# Patient Record
Sex: Male | Born: 1950 | Race: White | Hispanic: No | Smoking: Former smoker
Health system: Southern US, Community
[De-identification: ages and names within clinical notes are randomized; demographics above are authoritative.]

## PROBLEM LIST (undated history)

## (undated) DIAGNOSIS — I639 Cerebral infarction, unspecified: Secondary | ICD-10-CM

## (undated) DIAGNOSIS — I10 Essential (primary) hypertension: Secondary | ICD-10-CM

## (undated) DIAGNOSIS — F419 Anxiety disorder, unspecified: Secondary | ICD-10-CM

## (undated) DIAGNOSIS — E7800 Pure hypercholesterolemia, unspecified: Secondary | ICD-10-CM

## (undated) DIAGNOSIS — M1990 Unspecified osteoarthritis, unspecified site: Secondary | ICD-10-CM

## (undated) DIAGNOSIS — K219 Gastro-esophageal reflux disease without esophagitis: Secondary | ICD-10-CM

## (undated) DIAGNOSIS — I712 Thoracic aortic aneurysm, without rupture, unspecified: Secondary | ICD-10-CM

## (undated) DIAGNOSIS — N200 Calculus of kidney: Secondary | ICD-10-CM

## (undated) DIAGNOSIS — Z7901 Long term (current) use of anticoagulants: Secondary | ICD-10-CM

## (undated) DIAGNOSIS — I719 Aortic aneurysm of unspecified site, without rupture: Secondary | ICD-10-CM

## (undated) DIAGNOSIS — N400 Enlarged prostate without lower urinary tract symptoms: Secondary | ICD-10-CM

## (undated) DIAGNOSIS — I2510 Atherosclerotic heart disease of native coronary artery without angina pectoris: Secondary | ICD-10-CM

## (undated) DIAGNOSIS — E785 Hyperlipidemia, unspecified: Secondary | ICD-10-CM

## (undated) DIAGNOSIS — F329 Major depressive disorder, single episode, unspecified: Secondary | ICD-10-CM

## (undated) DIAGNOSIS — M6281 Muscle weakness (generalized): Secondary | ICD-10-CM

## (undated) DIAGNOSIS — IMO0001 Reserved for inherently not codable concepts without codable children: Secondary | ICD-10-CM

## (undated) DIAGNOSIS — I4891 Unspecified atrial fibrillation: Secondary | ICD-10-CM

## (undated) DIAGNOSIS — E039 Hypothyroidism, unspecified: Secondary | ICD-10-CM

## (undated) DIAGNOSIS — Z720 Tobacco use: Secondary | ICD-10-CM

## (undated) DIAGNOSIS — I4892 Unspecified atrial flutter: Secondary | ICD-10-CM

## (undated) DIAGNOSIS — I739 Peripheral vascular disease, unspecified: Secondary | ICD-10-CM

## (undated) DIAGNOSIS — I509 Heart failure, unspecified: Secondary | ICD-10-CM

## (undated) HISTORY — DX: Atherosclerotic heart disease of native coronary artery without angina pectoris: I25.10

## (undated) HISTORY — DX: Heart failure, unspecified: I50.9

## (undated) HISTORY — DX: Major depressive disorder, single episode, unspecified: F32.9

## (undated) HISTORY — DX: Hyperlipidemia, unspecified: E78.5

## (undated) HISTORY — DX: Benign prostatic hyperplasia without lower urinary tract symptoms: N40.0

## (undated) HISTORY — PX: CATARACT EXTRACTION W/ INTRAOCULAR LENS  IMPLANT, BILATERAL: SHX1307

## (undated) HISTORY — PX: APPENDECTOMY: SHX54

## (undated) HISTORY — DX: Hypothyroidism, unspecified: E03.9

## (undated) HISTORY — DX: Long term (current) use of anticoagulants: Z79.01

## (undated) HISTORY — DX: Unspecified atrial fibrillation: I48.91

## (undated) HISTORY — DX: Calculus of kidney: N20.0

## (undated) HISTORY — DX: Cerebral infarction, unspecified: I63.9

## (undated) HISTORY — DX: Peripheral vascular disease, unspecified: I73.9

## (undated) HISTORY — DX: Gastro-esophageal reflux disease without esophagitis: K21.9

## (undated) HISTORY — DX: Unspecified atrial flutter: I48.92

## (undated) HISTORY — PX: LITHOTRIPSY: SUR834

## (undated) HISTORY — DX: Muscle weakness (generalized): M62.81

## (undated) HISTORY — PX: CARDIOVERSION: SHX1299

## (undated) HISTORY — DX: Essential (primary) hypertension: I10

## (undated) HISTORY — DX: Tobacco use: Z72.0

## (undated) HISTORY — PX: CORONARY ANGIOPLASTY WITH STENT PLACEMENT: SHX49

## (undated) HISTORY — DX: Anxiety disorder, unspecified: F41.9

---

## 2004-05-26 DIAGNOSIS — I2510 Atherosclerotic heart disease of native coronary artery without angina pectoris: Secondary | ICD-10-CM

## 2004-05-26 DIAGNOSIS — I639 Cerebral infarction, unspecified: Secondary | ICD-10-CM

## 2004-05-26 HISTORY — DX: Cerebral infarction, unspecified: I63.9

## 2004-05-26 HISTORY — DX: Atherosclerotic heart disease of native coronary artery without angina pectoris: I25.10

## 2010-03-19 ENCOUNTER — Emergency Department (HOSPITAL_COMMUNITY): Admission: EM | Admit: 2010-03-19 | Discharge: 2010-03-20 | Payer: Self-pay | Admitting: Emergency Medicine

## 2010-04-17 ENCOUNTER — Emergency Department (HOSPITAL_COMMUNITY): Admission: EM | Admit: 2010-04-17 | Discharge: 2010-04-17 | Payer: Self-pay | Admitting: Emergency Medicine

## 2010-05-26 DIAGNOSIS — I4892 Unspecified atrial flutter: Secondary | ICD-10-CM

## 2010-05-26 DIAGNOSIS — Z7901 Long term (current) use of anticoagulants: Secondary | ICD-10-CM

## 2010-05-26 HISTORY — DX: Long term (current) use of anticoagulants: Z79.01

## 2010-05-26 HISTORY — DX: Unspecified atrial flutter: I48.92

## 2010-10-24 ENCOUNTER — Emergency Department (HOSPITAL_COMMUNITY): Payer: Medicare Other

## 2010-10-24 ENCOUNTER — Inpatient Hospital Stay (HOSPITAL_COMMUNITY)
Admission: EM | Admit: 2010-10-24 | Discharge: 2010-10-26 | Disposition: A | Discharge status: Other Acute Inpatient Hospital | Payer: Medicare Other | Source: Home / Self Care

## 2010-10-24 LAB — CBC
HCT: 41.6 % (ref 39.0–52.0)
Hemoglobin: 14 g/dL (ref 13.0–17.0)
RBC: 4.53 MIL/uL (ref 4.22–5.81)
RDW: 13 % (ref 11.5–15.5)
WBC: 11.4 10*3/uL — ABNORMAL HIGH (ref 4.0–10.5)

## 2010-10-24 LAB — DIFFERENTIAL
Basophils Absolute: 0.1 10*3/uL (ref 0.0–0.1)
Eosinophils Relative: 1 % (ref 0–5)
Lymphocytes Relative: 12 % (ref 12–46)
Neutro Abs: 9.5 10*3/uL — ABNORMAL HIGH (ref 1.7–7.7)
Neutrophils Relative %: 83 % — ABNORMAL HIGH (ref 43–77)

## 2010-10-25 ENCOUNTER — Inpatient Hospital Stay (HOSPITAL_COMMUNITY): Payer: Medicare Other

## 2010-10-25 DIAGNOSIS — Z7982 Long term (current) use of aspirin: Secondary | ICD-10-CM

## 2010-10-25 DIAGNOSIS — Z6379 Other stressful life events affecting family and household: Secondary | ICD-10-CM

## 2010-10-25 DIAGNOSIS — I129 Hypertensive chronic kidney disease with stage 1 through stage 4 chronic kidney disease, or unspecified chronic kidney disease: Secondary | ICD-10-CM | POA: Diagnosis present

## 2010-10-25 DIAGNOSIS — Z880 Allergy status to penicillin: Secondary | ICD-10-CM

## 2010-10-25 DIAGNOSIS — I059 Rheumatic mitral valve disease, unspecified: Secondary | ICD-10-CM

## 2010-10-25 DIAGNOSIS — R079 Chest pain, unspecified: Secondary | ICD-10-CM

## 2010-10-25 DIAGNOSIS — I428 Other cardiomyopathies: Secondary | ICD-10-CM | POA: Diagnosis present

## 2010-10-25 DIAGNOSIS — I5189 Other ill-defined heart diseases: Secondary | ICD-10-CM | POA: Diagnosis present

## 2010-10-25 DIAGNOSIS — I509 Heart failure, unspecified: Secondary | ICD-10-CM | POA: Diagnosis present

## 2010-10-25 DIAGNOSIS — E039 Hypothyroidism, unspecified: Secondary | ICD-10-CM | POA: Diagnosis present

## 2010-10-25 DIAGNOSIS — Z7902 Long term (current) use of antithrombotics/antiplatelets: Secondary | ICD-10-CM

## 2010-10-25 DIAGNOSIS — Z23 Encounter for immunization: Secondary | ICD-10-CM

## 2010-10-25 DIAGNOSIS — F17200 Nicotine dependence, unspecified, uncomplicated: Secondary | ICD-10-CM | POA: Diagnosis present

## 2010-10-25 DIAGNOSIS — I2510 Atherosclerotic heart disease of native coronary artery without angina pectoris: Secondary | ICD-10-CM | POA: Diagnosis present

## 2010-10-25 DIAGNOSIS — Z8673 Personal history of transient ischemic attack (TIA), and cerebral infarction without residual deficits: Secondary | ICD-10-CM

## 2010-10-25 DIAGNOSIS — Z9861 Coronary angioplasty status: Secondary | ICD-10-CM

## 2010-10-25 DIAGNOSIS — N182 Chronic kidney disease, stage 2 (mild): Secondary | ICD-10-CM | POA: Diagnosis present

## 2010-10-25 DIAGNOSIS — I4892 Unspecified atrial flutter: Secondary | ICD-10-CM

## 2010-10-25 DIAGNOSIS — M109 Gout, unspecified: Secondary | ICD-10-CM | POA: Diagnosis present

## 2010-10-25 DIAGNOSIS — I5023 Acute on chronic systolic (congestive) heart failure: Secondary | ICD-10-CM | POA: Diagnosis present

## 2010-10-25 DIAGNOSIS — E669 Obesity, unspecified: Secondary | ICD-10-CM | POA: Diagnosis present

## 2010-10-25 DIAGNOSIS — Z79899 Other long term (current) drug therapy: Secondary | ICD-10-CM

## 2010-10-25 LAB — CARDIAC PANEL(CRET KIN+CKTOT+MB+TROPI)
Relative Index: INVALID (ref 0.0–2.5)
Relative Index: INVALID (ref 0.0–2.5)
Total CK: 72 U/L (ref 7–232)
Total CK: 71 U/L (ref 7–232)
Troponin I: <0.3 ng/mL (ref <0.30)

## 2010-10-25 LAB — MAGNESIUM
Magnesium: 2.1 mg/dL (ref 1.5–2.5)
Magnesium: 2 mg/dL (ref 1.5–2.5)

## 2010-10-25 LAB — COMPREHENSIVE METABOLIC PANEL
ALT: 43 U/L (ref 0–53)
AST: 35 U/L (ref 0–37)
Calcium: 9.7 mg/dL (ref 8.4–10.5)
GFR calc Af Amer: >60 mL/min (ref >60)
Sodium: 141 mEq/L (ref 135–145)
Total Protein: 6.6 g/dL (ref 6.0–8.3)

## 2010-10-25 LAB — BASIC METABOLIC PANEL
BUN: 17 mg/dL (ref 6–23)
Calcium: 10 mg/dL (ref 8.4–10.5)
Chloride: 107 mEq/L (ref 96–112)
GFR calc non Af Amer: >60 mL/min (ref >60)
Glucose, Bld: 124 mg/dL — ABNORMAL HIGH (ref 70–99)
Glucose, Bld: 111 mg/dL — ABNORMAL HIGH (ref 70–99)
Potassium: 4 mEq/L (ref 3.5–5.1)
Sodium: 140 mEq/L (ref 135–145)

## 2010-10-25 LAB — CK TOTAL AND CKMB (NOT AT ARMC)
CK, MB: 3.6 ng/mL (ref 0.3–4.0)
CK, MB: 3.6 ng/mL (ref 0.3–4.0)
Total CK: 87 U/L (ref 7–232)

## 2010-10-25 LAB — PROTIME-INR: INR: 1.12 (ref 0.00–1.49)

## 2010-10-25 LAB — TROPONIN I: Troponin I: <0.3 ng/mL (ref <0.30)

## 2010-10-25 LAB — LIPID PANEL
Cholesterol: 153 mg/dL (ref 0–200)
VLDL: 20 mg/dL (ref 0–40)

## 2010-10-25 LAB — MRSA PCR SCREENING: MRSA by PCR: NEGATIVE

## 2010-10-25 LAB — CBC
HCT: 39.2 % (ref 39.0–52.0)
Hemoglobin: 13.2 g/dL (ref 13.0–17.0)
WBC: 9.7 10*3/uL (ref 4.0–10.5)

## 2010-10-25 LAB — DIFFERENTIAL
Basophils Absolute: 0.1 10*3/uL (ref 0.0–0.1)
Lymphocytes Relative: 23 % (ref 12–46)
Neutro Abs: 6.8 10*3/uL (ref 1.7–7.7)

## 2010-10-25 LAB — APTT: aPTT: 35 seconds (ref 24–37)

## 2010-10-25 MED ORDER — IOHEXOL 350 MG/ML SOLN
100.0000 mL | Freq: Once | INTRAVENOUS | Status: AC | PRN
  Administered 2010-10-25: 100 mL via INTRAVENOUS

## 2010-10-26 ENCOUNTER — Inpatient Hospital Stay (HOSPITAL_COMMUNITY)
Admission: RE | Admit: 2010-10-26 | Discharge: 2010-10-31 | DRG: 308 | Disposition: A | Discharge status: Home Health | Payer: Medicare Other | Source: Other Acute Inpatient Hospital | Attending: Internal Medicine | Admitting: Internal Medicine

## 2010-10-26 DIAGNOSIS — I4892 Unspecified atrial flutter: Secondary | ICD-10-CM

## 2010-10-26 DIAGNOSIS — I5021 Acute systolic (congestive) heart failure: Secondary | ICD-10-CM

## 2010-10-26 LAB — URINALYSIS, ROUTINE W REFLEX MICROSCOPIC
Bilirubin Urine: NEGATIVE
Glucose, UA: NEGATIVE mg/dL
Ketones, ur: NEGATIVE mg/dL
Specific Gravity, Urine: 1.015 (ref 1.005–1.030)
pH: 5.5 (ref 5.0–8.0)

## 2010-10-26 LAB — COMPREHENSIVE METABOLIC PANEL
ALT: 42 U/L (ref 0–53)
AST: 29 U/L (ref 0–37)
Albumin: 3.4 g/dL — ABNORMAL LOW (ref 3.5–5.2)
Alkaline Phosphatase: 58 U/L (ref 39–117)
GFR calc Af Amer: >60 mL/min (ref >60)
Glucose, Bld: 105 mg/dL — ABNORMAL HIGH (ref 70–99)
Potassium: 4.1 mEq/L (ref 3.5–5.1)
Sodium: 143 mEq/L (ref 135–145)
Total Protein: 6.4 g/dL (ref 6.0–8.3)

## 2010-10-26 LAB — DIFFERENTIAL
Basophils Absolute: 0.1 10*3/uL (ref 0.0–0.1)
Basophils Relative: 2 % — ABNORMAL HIGH (ref 0–1)
Eosinophils Relative: 2 % (ref 0–5)
Lymphocytes Relative: 20 % (ref 12–46)

## 2010-10-26 LAB — PROTIME-INR
INR: 1.18 (ref 0.00–1.49)
Prothrombin Time: 15.2 seconds (ref 11.6–15.2)

## 2010-10-26 LAB — CBC
HCT: 39.7 % (ref 39.0–52.0)
Platelets: 220 10*3/uL (ref 150–400)
RDW: 13.2 % (ref 11.5–15.5)
WBC: 8.4 10*3/uL (ref 4.0–10.5)

## 2010-10-26 LAB — URINE MICROSCOPIC-ADD ON

## 2010-10-26 LAB — MRSA PCR SCREENING: MRSA by PCR: NEGATIVE

## 2010-10-27 ENCOUNTER — Inpatient Hospital Stay (HOSPITAL_COMMUNITY): Payer: Medicare Other

## 2010-10-27 LAB — BASIC METABOLIC PANEL
BUN: 25 mg/dL — ABNORMAL HIGH (ref 6–23)
CO2: 24 mEq/L (ref 19–32)
GFR calc non Af Amer: 54 mL/min — ABNORMAL LOW (ref >60)
Glucose, Bld: 105 mg/dL — ABNORMAL HIGH (ref 70–99)
Potassium: 3.8 mEq/L (ref 3.5–5.1)

## 2010-10-27 LAB — CARDIAC PANEL(CRET KIN+CKTOT+MB+TROPI)
CK, MB: 1.9 ng/mL (ref 0.3–4.0)
Total CK: 36 U/L (ref 7–232)
Troponin I: <0.3 ng/mL (ref <0.30)

## 2010-10-27 LAB — URINE CULTURE
Colony Count: 4000
Culture  Setup Time: 201206022019

## 2010-10-27 LAB — HEPARIN LEVEL (UNFRACTIONATED): Heparin Unfractionated: 0.69 IU/mL (ref 0.30–0.70)

## 2010-10-27 LAB — CBC
Hemoglobin: 12.6 g/dL — ABNORMAL LOW (ref 13.0–17.0)
Platelets: 202 10*3/uL (ref 150–400)
RBC: 4.03 MIL/uL — ABNORMAL LOW (ref 4.22–5.81)

## 2010-10-27 NOTE — H&P (Addendum)
Sean Hardy, Sean Hardy NO.:  [id number]  MEDICAL RECORD NO.:  [id number]           PATIENT TYPE:  LOCATION:                                 FACILITY:  PHYSICIAN:  Vania Rea, M.D. DATE OF BIRTH:  1950/12/21  DATE OF ADMISSION:  10/25/2010 DATE OF DISCHARGE:  LH                             HISTORY & PHYSICAL   PRIMARY CARE PHYSICIAN:   Mickle Plumb, NP, at [facility] in [place].  UROLOGIST:  Dennie Maizes, MD.  CHIEF COMPLAINT:  Chest pain and shortness of breath since tonight.  HISTORY OF PRESENT ILLNESS:  This is a 60 year old Caucasian gentleman with a history of coronary artery disease status post stenting at [facility] in 2003, who also has a history of right hemispheric stroke in 2006.  Reports thatshe has been having waking with episodic shortness of breath for the past few nights and tonight had sudden onset of chest pain associated with shortness of breath.  He reported the pain is about 7/10.  His daughter who lives with him gave him sublingual nitroglycerin, which brought some relief.  After three doses, the pain went from a 7/10 to 4/10 and she decided to bring him to the emergency room for further evaluation.  He described his pain as midsternal chest pain, radiating to the left side of his chest, did not go down to his arm.  There was no diaphoresis or dizziness.  He has had no history of fever nor cough.  He has had no bloody nor black stool.  He has had no diarrhea.  The patient used to be seen by a cardiologist at [facility], but has not been back to [place] since 2006.  He reports he had an ischemic stroke resulting in left-sided weakness and he is now almost fully recovered and walks without assistance.  On further questioning, he says he thinks he did have an irregular heart rates in years gone by and he was on warfarin at one time, however, he also reports that he was on Pradaxa in 2010, even though I do  not think Pradaxa was available at that time.  His daughter has only been living with him for the past 4 months and does not have a very clear idea of what medications he used to be on for the past 4 months.  He currently is not on any anticoagulant.  He does take Plavix.  PAST MEDICAL HISTORY: 1. Coronary artery disease status post stenting x3 in 2003. 2. Hypertension. 3. Gout. 4. Hypothyroidism. 5. History of right hemispheric stroke in 2006.  MEDICATIONS:  Per his daughter he takes: 1. Amlodipine/benazepril 10/20 daily. 2. Plavix 75 mg daily. 3. Uloric 40 mg daily. 4. Levothyroxine 150 mcg daily. 5. Vesicare  5 mg daily. 6. Niaspan 1000 mg at bedtime. 7. Vitamin B12 by injection monthly.  ALLERGIES:  PENICILLIN.  SOCIAL HISTORY:  Reports that he has been smoking for over 50 years.  He used to smoke as much as a pack a day, now down to three cigarettes per day.  Denies alcohol or illicit drug use.  He  used to work in [employer]. He is now retired on disability because of his heart disease.  Lives in [place].  No longer lives with his wife, but his daughter now moved in with him.  FAMILY HISTORY:  He is an only child.  He reports that apart from dementia in both parents, they had no cardiovascular or any other major organ disease.  His father was a heavy abuser of crack cocaine he reports.  REVIEW OF SYSTEMS:  Other than noted above significant for episodic gout in his left ankle with significant for residual weakness in his left hand after the stroke.  He has occasional wheezing for which he sometimes uses his wife's inhalers.  PHYSICAL EXAM:  GENERAL:  Pleasant, middle-aged Caucasian gentleman, reclining in the stretcher. VITALS:  Temperature is 97.3, pulse varying between 77 and 146, respiratory rate 22, his blood pressure is 133/90, saturating at 94% on room air. HEENT:  He has multiple missing teeth.  No cervical lymphadenopathy or thyromegaly.  No carotid  bruits. CHEST:  He has occasional rhonchi. CARDIOVASCULAR SYSTEM:  Irregularly regular rhythm. ABDOMEN:  Obese, soft, nontender.  No masses. EXTREMITIES:  He has no edema. He has arthritic deformities of the knees, ankles, toes.  Dorsalis pedis pulses 1+ bilaterally. SKIN:  Warm.  There are no ulcerations. CENTRAL NERVOUS SYSTEM:  Cranial nerves II-XII are grossly intact.  He has 4/5 weakness of the left upper extremity and inability to extend all fingers of the left hand, but he has 5/5 power in all other extremities. His gait was not tested.  LABS:  White count is slightly elevated at 11.4, hemoglobin 14.0, platelets 237.  He has 83% neutrophils.  Absolute granulocyte count elevated to 9.5.  His differential is otherwise unremarkable.  His sodium is 140, potassium is 3.9, chloride 108, CO2 22, glucose 104, BUN 15, creatinine 1.1, calcium 10.0.  His cardiac enzymes are completely normal with undetectable troponins, total CK of 76, CK-MB is 3.6. Repeat 3 hours later remains normal.  His EKG shows atrial flutter with varying rates and varying degrees of block throughout his emergency room course.  Portable chest x-ray shows vascular congestion and mild cardiomegaly with central increased interstitial markings compatible with pulmonary edema.  ASSESSMENT: 1. Atrial flutter with variable block and rapid response, questionable     new onset versus paroxysmal flutter. 2. Coronary artery disease. 3. Hypertension. 4. Hypothyroidism. 5. History of stroke with residual left upper extremity weakness. 6. History of gout.  PLAN: 1. The differential for this atrial flutter includes primary     conduction abnormality versus conduction abnormality secondary to     structural heart disease, also include a pulmonary embolus on acute     myocardial infarction or worsening of his hypothyroidism. 2. Therefore, we will continue to get serial cardiac enzymes. We will     get a D-dimer and if it is  significantly elevated and depending on     results of other tests, we will consider ruling him out for     pulmonary embolus.  We will continue Cardizem drip and give full     anticoagulation.  We will request his records from [facility]     and we will consult the cardiologist in the morning. 3. Other plans as per orders.     Vania Rea, M.D.     LC/MEDQ  D:  10/25/2010  T:  10/25/2010  Job:  [id number]  cc:   [facility]  Mickle Plumb, MD [facility]  Medicine in [facility]  Electronically Signed by Vania Rea M.D. on 10/27/2010 05:33:54 AM

## 2010-10-28 ENCOUNTER — Encounter: Payer: Self-pay | Admitting: *Deleted

## 2010-10-28 LAB — BASIC METABOLIC PANEL
Calcium: 9.1 mg/dL (ref 8.4–10.5)
GFR calc Af Amer: >60 mL/min (ref >60)
GFR calc non Af Amer: 59 mL/min — ABNORMAL LOW (ref >60)
Glucose, Bld: 115 mg/dL — ABNORMAL HIGH (ref 70–99)
Potassium: 4 mEq/L (ref 3.5–5.1)
Sodium: 136 mEq/L (ref 135–145)

## 2010-10-28 LAB — CBC
MCV: 90.3 fL (ref 78.0–100.0)
Platelets: 228 10*3/uL (ref 150–400)
RBC: 4.34 MIL/uL (ref 4.22–5.81)
RDW: 13.1 % (ref 11.5–15.5)
WBC: 8.7 10*3/uL (ref 4.0–10.5)

## 2010-10-28 LAB — HEPARIN LEVEL (UNFRACTIONATED): Heparin Unfractionated: 0.26 IU/mL — ABNORMAL LOW (ref 0.30–0.70)

## 2010-10-29 LAB — CBC
HCT: 41.3 % (ref 39.0–52.0)
Hemoglobin: 14.6 g/dL (ref 13.0–17.0)
MCHC: 35.4 g/dL (ref 30.0–36.0)
RBC: 4.55 MIL/uL (ref 4.22–5.81)
WBC: 10.2 10*3/uL (ref 4.0–10.5)

## 2010-10-31 LAB — BASIC METABOLIC PANEL
Chloride: 101 mEq/L (ref 96–112)
Creatinine, Ser: 1.29 mg/dL (ref 0.4–1.5)
GFR calc Af Amer: >60 mL/min (ref >60)
Potassium: 4.1 mEq/L (ref 3.5–5.1)
Sodium: 136 mEq/L (ref 135–145)

## 2010-10-31 LAB — PRO B NATRIURETIC PEPTIDE: Pro B Natriuretic peptide (BNP): 2127 pg/mL — ABNORMAL HIGH (ref 0–125)

## 2010-11-10 NOTE — Discharge Summary (Signed)
NAMESALAAM, Sean Hardy                  ACCOUNT NO.:  [id number]  MEDICAL RECORD NO.:  [id number]  LOCATION:  3713                         FACILITY:  [facility]  PHYSICIAN:  Pricilla Riffle, MD, FACCDATE OF BIRTH:  05/08/1951  DATE OF ADMISSION:  10/26/2010 DATE OF DISCHARGE:  10/31/2010                              DISCHARGE SUMMARY   PRIMARY CARDIOLOGIST:  Gerrit Friends. Dietrich Pates, MD, [facility].  PRIMARY CARE PROVIDER:  Mickle Plumb, AGNP-C, at [facility] in [place].  ELECTROPHYSIOLOGIST:  Doylene Canning. Ladona Ridgel, MD  DISCHARGE DIAGNOSIS:  Atrial flutter with rapid ventricular response.  SECONDARY DIAGNOSES: 1. 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 2. Coronary artery disease, status post prior stenting. 3. History of right-sided CVA in 2008. 4. Hypertension. 5. Gout. 6. Hypothyroidism. 7. Tobacco abuse. 8. Acute systolic congestive heart failure, question tachycardia     mediated - EF 20-25% by transesophageal echocardiogram. 9. Stage II chronic kidney disease.  ALLERGIES:  PENICILLIN.  PROCEDURES:  CT angio of the chest performed October 25, 2010, at [facility] showing no evidence of pulmonary embolus.  There is small bilateral pleural effusion.  PROCEDURE: 1. Two-D echocardiogram October 25, 2010, at [facility] showing an EF of     15% with mild diffuse hypokinesis and mildly dilated cavity.  Mild     mitral regurgitation.  PSP 35 mmHg. 2. October 28, 2010, transesophageal echocardiogram at [facility], EF 20-     25% with partially organized thrombus in the left atrial appendage     and extensive smoke in the left atrium.  The right atrium showed     spontaneous echocontrast.  HISTORY OF PRESENT ILLNESS:  A 60 year old male with the above problem list who was in his usual state of health on approximately May 30 when he had sudden onset of dyspnea with fullness in his throat and trouble swallowing along with chest pressure radiating to  the left chest. Symptoms were better with rest, but persisted and therefore was taken to [facility] on June 1 where he was found to be in atrial flutter with rapid ventricle response at rate in the 140s.  Chest x-ray suggested heart failure with pulmonary edema.  The patient was initially treated with bolus followed by infusion and placed on Lovenox anticoagulation.  He was admitted to the Internal Medicine Service and initiated on IV diuresis and Cardiology was subsequently consulted. Echocardiogram was done showing an EF of 15%, and therefore it was felt that the patient should not be treated diltiazem and this was discontinued and beta-blockers were titrated.  Unfortunately, this resulted in increased heart rates and decision was made to transfer to [facility] for further cardiac evaluation.  HOSPITAL COURSE:  Despite rapid rates and low LV function, the patient's cardiac markers were negative, suggested that perhaps this was nonischemic in nature and more likely his heart failure and cardiomyopathy were tachycardia induced.  His rates were difficult to control, the patient was placed on amiodarone 400 mg b.i.d. and consideration was given to TEE and atrial flutter ablation versus cardioversion.  Case was reviewed with Electrophysiology and initially the patient was placed on  Coumadin.  Transesophageal echocardiogram was undertaken on June 4, which unfortunately showed left atrial appendage thrombus and smoke, and right atrial smoke as well.  In this setting, plans for ablation were set aside and the patient's Coumadin was discontinued and he was instead placed on Pradaxa 150 mg q.12 h.  On amiodarone, rates have been in the 80s and the patient has been less symptomatic.  Further, the patient's has diuresed with reduction in weight from 89.8 kg on admission to 86.5 kg on June 6.  His Lasix was transitioned to oral formulation starting on June 3 and his renal function has  remained stable.  Plan is for discharging the patient today.  He will remain on Pradaxa therapy b.i.d. going forward and will follow up with Joni Reining, nurse practitioner, Dr. Ladona Ridgel in our [place] office on June 22 at 11:00 a.m. with plans to arrange for TEE and ablation after 3 weeks of anticoagulation.  Of note, during this admission, the patient was seen by Social Work due to concern that Ms. Fenstermaker is not being cared for at home properly.  He currently lives with his stepdaughter.  Social Work has been in contact with Adult Management consultant in [place] and at this time there is not a plan to open an Adult Protective Service case.  Home health RN has been arranged.  DISCHARGE LABS:  Hemoglobin 14.6, hematocrit 41.3, WBC 10.2, platelets 262.  D-dimer 0.60.  Sodium 136, potassium 4.1, chloride 101, CO2 of 25, BUN 26, creatinine 1.29, glucose 119, total bilirubin 0.6, alkaline phosphatase 58, AST 29, ALT 42, total protein 6.4, albumin 3.4, calcium 8.8, magnesium 2.1, CK 36, MB 1.9, troponin-I less than 0.30.  BNP was 2127.  Total cholesterol 153, triglycerides 101, HDL 29, LDL 104.  TSH 12.737, though free T4 was 1.22.  Urinalysis was negative.  MRSA screen was negative.  Urine culture showed insignificant growth.  DISPOSITION:  The patient will be discharged home today in good condition.  FOLLOWUP APPOINTMENTS:  The patient will follow up with Joni Reining, nurse practitioner, at [facility] in [place] on June 22 at 11 a.m.  Dr. Ladona Ridgel is also in the office this day and tentative plan is for arrangements at that time to be made for eventual flutter ablation.  The patient will continue to follow up Dr. Larina Bras in [place] as previously scheduled.  DISCHARGE MEDICATIONS: 1. Amiodarone 400 mg b.i.d. 2. Aspirin 81 mg daily. 3. Dabigatran 150 mg q.12 h. 4. Digoxin 0.25 mg daily. 5. Furosemide 40 mg b.i.d. 6. Lipitor 40 mg q.h.s. 7. Lisinopril 5 mg  daily. 8. Metoprolol 50 mg one and half tablets b.i.d. 9. Nitroglycerin 0.4 mg sublingual p.r.n. chest pain. 10.Potassium chloride 20 mEq b.i.d. 11.Vitamin B12 1000 mcg IM q. month. 12.Synthroid 150 mcg daily. 13.Niaspan 1000 mg nightly. 14.Tylenol Extra Strength 500 mg 2 tablets q.6 h. p.r.n. 15.Uloric 40 mg daily. 16.Vesicare 5 mg daily.  OUTSTANDING LAB STUDIES:  None.  DURATION DISCHARGE ENCOUNTER:  Sixty minutes including physician time.     Nicolasa Ducking, ANP   ______________________________ Pricilla Riffle, MD, [facility]    CB/MEDQ  D:  10/31/2010  T:  11/01/2010  Job:  [id number]  cc:   Mickle Plumb, AGNP-C  Electronically Signed by Nicolasa Ducking ANP on 11/06/2010 04:24:11 PM Electronically Signed by Dietrich Pates MD FACC on 11/10/2010 11:09:10 PM

## 2010-11-15 ENCOUNTER — Ambulatory Visit (INDEPENDENT_AMBULATORY_CARE_PROVIDER_SITE_OTHER): Payer: Medicare Other | Admitting: Adult Health

## 2010-11-15 ENCOUNTER — Encounter: Payer: Self-pay | Admitting: Adult Health

## 2010-11-15 ENCOUNTER — Other Ambulatory Visit: Payer: Self-pay | Admitting: Adult Health

## 2010-11-15 DIAGNOSIS — IMO0002 Reserved for concepts with insufficient information to code with codable children: Secondary | ICD-10-CM

## 2010-11-15 DIAGNOSIS — I10 Essential (primary) hypertension: Secondary | ICD-10-CM | POA: Insufficient documentation

## 2010-11-15 DIAGNOSIS — T460X5A Adverse effect of cardiac-stimulant glycosides and drugs of similar action, initial encounter: Secondary | ICD-10-CM

## 2010-11-15 DIAGNOSIS — I2510 Atherosclerotic heart disease of native coronary artery without angina pectoris: Secondary | ICD-10-CM

## 2010-11-15 DIAGNOSIS — Z7901 Long term (current) use of anticoagulants: Secondary | ICD-10-CM

## 2010-11-15 DIAGNOSIS — I428 Other cardiomyopathies: Secondary | ICD-10-CM

## 2010-11-15 DIAGNOSIS — I4891 Unspecified atrial fibrillation: Secondary | ICD-10-CM

## 2010-11-15 DIAGNOSIS — Z720 Tobacco use: Secondary | ICD-10-CM

## 2010-11-15 DIAGNOSIS — I5189 Other ill-defined heart diseases: Secondary | ICD-10-CM

## 2010-11-15 DIAGNOSIS — I513 Intracardiac thrombosis, not elsewhere classified: Secondary | ICD-10-CM

## 2010-11-15 MED ORDER — AMIODARONE HCL 400 MG PO TABS: 200.0000 mg | ORAL_TABLET | Freq: Every day | ORAL | Status: DC

## 2010-11-15 NOTE — Progress Notes (Signed)
HPI: [name] is a 60 y/o patient of Dr. Dietrich Pates and Dr. Ladona Ridgel we are following for assessment and treatment of CAD, with recent echo demonstrating EF of 15% (June 2012), who was recently admitted to [facility] hospital with Afib RVR rate in the 140's, pulmonary edema after initial assessment in [facility] ER.  He was placed on amiodarone and coumadin.  He was seen by EP, Dr. Ladona Ridgel at [facility]. A TEE was completed and he was found to have a left atrial appendage thrombus with right atrial smoke as well.  His coumadin was discontinued and he was started on pradaxam, with plans to arrange for TEE ablation after 3 weeks of anticoagulation and follow-up with Dr. Ladona Ridgel.  He is here today with complaints of generalized weakness. He has been very compliant with his medications, low salt diet and tries to remain active.  He denies bleeding problems, dizziness or palpitations. He has had some complaints of frequent diarrhea recently.  Allergies  Allergen Reactions  . Penicillins     Current Outpatient Prescriptions  Medication Sig Dispense Refill  . acetaminophen (TYLENOL) 500 MG tablet Take 500 mg by mouth every 6 (six) hours as needed.        Marland Kitchen amiodarone (PACERONE) 400 MG tablet Take 0.5 tablets (200 mg total) by mouth daily.  30 tablet  3  . aspirin 81 MG tablet Take 81 mg by mouth daily.        Marland Kitchen atorvastatin (LIPITOR) 40 MG tablet Take 40 mg by mouth daily.        . Cyanocobalamin (VITAMIN B-12 IJ) Inject 1 applicator as directed every 30 (thirty) days.        . dabigatran (PRADAXA) 150 MG CAPS Take 150 mg by mouth every 12 (twelve) hours.        . digoxin (LANOXIN) 0.25 MG tablet Take 250 mcg by mouth daily.        . febuxostat (ULORIC) 40 MG tablet Take 80 mg by mouth daily.        . furosemide (LASIX) 40 MG tablet Take 40 mg by mouth 2 (two) times daily.        Marland Kitchen levothyroxine (SYNTHROID, LEVOTHROID) 150 MCG tablet Take 150 mcg by mouth daily.        Marland Kitchen lisinopril (PRINIVIL,ZESTRIL) 5 MG tablet Take 5 mg by  mouth daily.        . metoprolol (TOPROL-XL) 50 MG 24 hr tablet Take 50 mg by mouth daily.        . niacin (NIASPAN) 1000 MG CR tablet Take 1,000 mg by mouth at bedtime.        . nitroGLYCERIN (NITROSTAT) 0.4 MG SL tablet Place 0.4 mg under the tongue every 5 (five) minutes as needed.        . potassium chloride SA (K-DUR,KLOR-CON) 20 MEQ tablet Take 20 mEq by mouth 2 (two) times daily.        . solifenacin (VESICARE) 5 MG tablet Take 10 mg by mouth daily.        Marland Kitchen DISCONTD: amiodarone (PACERONE) 400 MG tablet Take 400 mg by mouth 2 (two) times daily.          Past Medical History  Diagnosis Date  . Coronary artery disease 2006    Stents to unknown arteries by cardiologist at [facility]   . Hypertension   . CVA (cerebral infarction) 2006    Right hemispheric  . Hypothyroidism     History reviewed. No pertinent past surgical history.  WJX:BJYNWG of systems complete and found to be negative unless listed above PHYSICAL EXAM BP 120/68  Pulse 48  Ht 6\' 2"  (1.88 m)  Wt 185 lb (83.915 kg)  BMI 23.75 kg/m2  SpO2 96% General: Well developed, well nourished, thin in no acute distress Head: Eyes PERRLA, No xanthomas.Wearing glasses   Normal cephalic and atramatic  Lungs: Clear bilaterally to auscultation and percussion. Heart: HRRR S1 S2, bradycardic.  Pulses are 2+ & equal.            No carotid bruit. No JVD.  No abdominal bruits. No femoral bruits. Abdomen: Bowel sounds are positive, abdomen soft and non-tender without masses or                  Hernia's noted. Msk:  Back normal, normal gait. Normal strength and tone for age. Extremities: No clubbing, cyanosis or edema.  DP +1 Neuro: Alert and oriented X 3. Psych:  Good affect, responds appropriately EKG:SR with 1st degree block, rate of 37bpm, prominent U-waves.  ASSESSMENT AND PLAN

## 2010-11-15 NOTE — Assessment & Plan Note (Signed)
He will continue ASA, and ongoing management of CVRF.

## 2010-11-15 NOTE — Assessment & Plan Note (Signed)
He will continue on Pradaxa as he was placed by Dr.Taylor on admission in June of 2012.  This can be reassessed at a later date at his discretion to evaluate for resolution.

## 2010-11-15 NOTE — Assessment & Plan Note (Signed)
He has been loaded with 400 mg of amiodarone for 15 days.  I have discussed with Dr. Dietrich Pates the new dosage of amiodarone and he advised to place him on 200 mg daily.  He will continue on digoxin and metoprolol.  He is now in sinus brady with lst degree block.  He is feeling fatigued. The new dose should be helpful in these symptoms. We will repeat his echocardiogram to assess LV fx now that he is back in SR.  May need to consider cardiac monitor if he has recurrent Afib or complaints of palpitations.  He will have BMET, DIG leve and CBC. These will be reviewed and more recommendations will be made on follow-up appointment with Dr. Dietrich Pates.

## 2010-11-15 NOTE — Assessment & Plan Note (Signed)
Cessation is discussed. He is down from 1 ppd to 2 cigarettes a day.  I have encouraged him to go ahead and quit.

## 2010-11-15 NOTE — Patient Instructions (Signed)
Your physician has requested that you have an echocardiogram. Echocardiography is a painless test that uses sound waves to create images of your heart. It provides your doctor with information about the size and shape of your heart and how well your heart's chambers and valves are working. This procedure takes approximately one hour. There are no restrictions for this procedure.  Your physician has requested that you have a stress echocardiogram. For further information please visit [email or web address] Please follow instruction sheet as given.  Your physician recommends that you return for lab work in: today  Your physician has recommended you make the following change in your medication: decrease Amiodarone to 200mg  daily  Your physician recommends that you schedule a follow-up appointment in: 2 weeks

## 2010-11-15 NOTE — Assessment & Plan Note (Signed)
As stated, will repeat his echo. Will plan stress echo in 6 weeks.  If he continues to have severely decreased systolic fx, will consider ICD.  He should follow-up with Dr. Ladona Ridgel once he is evaluated by Dr. Dietrich Pates at his discretion.

## 2010-11-15 NOTE — Assessment & Plan Note (Signed)
Controlled at present.  

## 2010-11-16 ENCOUNTER — Telehealth: Payer: Self-pay | Admitting: Cardiovascular Disease

## 2010-11-16 ENCOUNTER — Telehealth: Payer: Self-pay | Admitting: Adult Health

## 2010-11-16 LAB — BASIC METABOLIC PANEL
CO2: 29 mEq/L (ref 19–32)
Calcium: 9.9 mg/dL (ref 8.4–10.5)
Potassium: 4.7 mEq/L (ref 3.5–5.3)
Sodium: 144 mEq/L (ref 135–145)

## 2010-11-16 LAB — CBC WITH DIFFERENTIAL/PLATELET
Eosinophils Absolute: 0.1 10*3/uL (ref 0.0–0.7)
Hemoglobin: 14.7 g/dL (ref 13.0–17.0)
Lymphs Abs: 1.4 10*3/uL (ref 0.7–4.0)
MCH: 31.5 pg (ref 26.0–34.0)
Monocytes Relative: 6 % (ref 3–12)
Neutro Abs: 6.4 10*3/uL (ref 1.7–7.7)
Neutrophils Relative %: 74 % (ref 43–77)
RBC: 4.67 MIL/uL (ref 4.22–5.81)

## 2010-11-16 NOTE — Telephone Encounter (Signed)
Was called with critical lab value - Digoxin level of 3.6. Creatinine of 2.05.  I called patient and left a message on machine to stop Digoxin until he hears back from [place] and to call me back at [phone] to discuss other medication changes.    He appears to be volume depleted.  Creatinine is higher that 3 weeks ago. ( 1.26)  He has also been on Amio.  Will instruct him to hold Lasix today and tomorrow. Hold digoxin. Call Dr. Dietrich Pates on Monday.

## 2010-11-16 NOTE — Telephone Encounter (Signed)
Made two attempts to cal patient at numbers listed.Called his daughters number, disconnected, work number not valid.  Left message on pts answering machine to call our office as soon as he got message.

## 2010-11-18 ENCOUNTER — Telehealth: Payer: Self-pay | Admitting: *Deleted

## 2010-11-18 DIAGNOSIS — R7989 Other specified abnormal findings of blood chemistry: Secondary | ICD-10-CM

## 2010-11-18 NOTE — Telephone Encounter (Signed)
Pt's daughter returned our call Instructions were left on pt's machine over the weekend by KL, Pt stopped his dig and lasix on Saturday morning Will have bmp on 11/19/10

## 2010-11-18 NOTE — Telephone Encounter (Signed)
labwork ordered and faxed to [employer]

## 2010-11-18 NOTE — Telephone Encounter (Signed)
Per Zonia Kief pt's digoxin and creatinine is elevated per labwork Pt needs to stop digoxin and lasix and repeat bmp in 2 days Unable to reach at this time, left messages on Pt and emergency contacts telephones Await return call.

## 2010-11-19 ENCOUNTER — Other Ambulatory Visit: Payer: Self-pay | Admitting: Adult Health

## 2010-11-20 ENCOUNTER — Other Ambulatory Visit (HOSPITAL_COMMUNITY): Payer: Medicare Other

## 2010-11-20 ENCOUNTER — Ambulatory Visit (HOSPITAL_COMMUNITY): Payer: Medicare Other

## 2010-11-20 LAB — BASIC METABOLIC PANEL
CO2: 27 mEq/L (ref 19–32)
Glucose, Bld: 92 mg/dL (ref 70–99)
Potassium: 5 mEq/L (ref 3.5–5.3)
Sodium: 142 mEq/L (ref 135–145)

## 2010-11-21 ENCOUNTER — Ambulatory Visit (HOSPITAL_COMMUNITY)
Admission: RE | Admit: 2010-11-21 | Discharge: 2010-11-21 | Disposition: A | Discharge status: Home / Self Care | Payer: Medicare Other | Source: Ambulatory Visit | Attending: Cardiology | Admitting: Cardiology

## 2010-11-21 ENCOUNTER — Other Ambulatory Visit: Payer: Self-pay | Admitting: *Deleted

## 2010-11-21 ENCOUNTER — Encounter: Payer: Self-pay | Admitting: *Deleted

## 2010-11-21 DIAGNOSIS — I369 Nonrheumatic tricuspid valve disorder, unspecified: Secondary | ICD-10-CM

## 2010-11-21 DIAGNOSIS — R609 Edema, unspecified: Secondary | ICD-10-CM

## 2010-11-21 DIAGNOSIS — I10 Essential (primary) hypertension: Secondary | ICD-10-CM | POA: Insufficient documentation

## 2010-11-21 DIAGNOSIS — I2510 Atherosclerotic heart disease of native coronary artery without angina pectoris: Secondary | ICD-10-CM | POA: Insufficient documentation

## 2010-11-21 MED ORDER — DIGOXIN 250 MCG PO TABS: 125.0000 ug | ORAL_TABLET | Freq: Every day | ORAL | Status: DC

## 2010-11-21 MED ORDER — FUROSEMIDE 40 MG PO TABS: ORAL_TABLET | ORAL | Status: DC

## 2010-11-21 NOTE — Telephone Encounter (Signed)
Left detailed message on pt's telephone, decreased dig to,  0.125mg  daily and hold lasix, call for a 4lb wt gain-THS

## 2010-11-21 NOTE — Telephone Encounter (Signed)
Left detailed message of digoxin changes , called new rx in to pharmacy and ordered cmp for 1 wk

## 2010-11-21 NOTE — Telephone Encounter (Signed)
Telephone notes and laboratory values obtained 6/26 reviewed Restart digoxin at a dose of 0.125 mg q.d. Daily weights at home; call for increase of 4 pounds. CMet in one week.

## 2010-11-22 ENCOUNTER — Encounter: Payer: Self-pay | Admitting: Cardiology

## 2010-11-25 NOTE — Consult Note (Addendum)
Sean Hardy, Sean Hardy                  ACCOUNT NO.:  [id number]  MEDICAL RECORD NO.:  [id number]           PATIENT TYPE:  I  LOCATION:  IC01                          FACILITY:  [facility]  PHYSICIAN:  Gerrit Friends. Dietrich Pates, MD, FACCDATE OF BIRTH:  January 31, 1951  DATE OF CONSULTATION:  10/25/2010 DATE OF DISCHARGE:                                CONSULTATION   PRIMARY CARDIOLOGIST:  Gerrit Friends. Dietrich Pates, MD, [facility].  He has previously been seen by the cardiologist of [facility].  PRIMARY CARE PHYSICIAN:  Dr. Mickle Plumb in [place].  REQUESTING PHYSICIAN:  [facility].  REASON FOR CONSULTATION:  Chest pain, atrial flutter with known history of CAD.  HISTORY OF PRESENT ILLNESS:  This is a 60 year old Caucasian male with known history of CAD with stents x3 to unknown arteries via cardiologist at [facility] in between 2000 and 2005.  The patient has been lost to followup.  He was admitted apparently in 2008 with a CVA. The patient gets followed by a primary care physician in [place], but has not been followed up with Cardiology.  The patient had sudden onset of dyspnea 2 days prior to admission, was worsened with a full feeling in his throat, trouble swallowing along with chest pressure radiating to the left chest.  The patient sat down and it went away, but then returned the following day.  The patient's daughter brought him to the emergency room.  Chest x-ray revealed positive for CHF and pulmonary edema along with a EKG revealing atrial flutter with RVR at 145 beats per minute.  The patient was given a Cardizem bolus and started on a Cardizem drip.  He was also given 5 mg of metoprolol and placed on Lovenox.  We are asked to follow the patient with more recommendations. Currently, the patient is pain free, is breathing better and has begun to diurese on Lasix IV.  Echocardiogram has been ordered, but not completed at the time of this dictation.  REVIEW OF  SYSTEMS:  Chest pain, shortness of breath, trouble swallowing and weakness.  All other systems are reviewed and are found to be negative.  CODE STATUS:  Full code.  PAST MEDICAL HISTORY: 1. CAD with stents x3 to unknown artery at various days between 2000     and 2005 according to the patient. 2. Right-sided CVA in 2008. 3. Hypertension. 4. Gout. 5. Hypothyroidism.  PAST SURGICAL HISTORY:  None.  SOCIAL HISTORY:  He lives in [place] with his daughter.  He is disabled.  He is a 50 pack-year smoker.  Negative for EtOH or drug abuse.  The patient's intellect is slow.  FAMILY HISTORY:  The patient is only child.  FAMILY HISTORY:  Only documentation is cocaine abuse in his father.  MEDICATIONS PRIOR TO ADMISSION: 1. Amlodipine 10/20. 2. Plavix 75 mg daily. 3. Uloric 40 mg daily. 4. Levothyroxine 150 mg daily. 5. VESIcare 50 mg daily. 6. Niaspan 1000 mg at bedtime. 7. Vitamin B12 monthly.  ALLERGIES:  To PENICILLIN.  CURRENT LABORATORY DATA:  Sodium 140, potassium 3.9, chloride 108, CO2 22, BUN 15, creatinine 1.1,  glucose 124.  Hemoglobin 13.2, hematocrit 39.2, white blood cells 9.7, platelets 222.  PT 14.6, INR 1.12. Troponin's are negative at less than 0.30, less than 0.30 and less than 0.30 respectively.  EKG revealing atrial flutter with a rate of 108 beats per minute with nonspecific lateral T-wave flattening.  RADIOLOGY:  Vascular congestion with mild cardiomegaly and centrally increased interstitial markings compatible with pulmonary edema.  PHYSICAL EXAMINATION:  VITAL SIGNS:  Blood pressure 116/82, pulse 115, respirations 20, temperature 97.5, O2 sat 93% on 2 L, his weight is 89.8 kg. GENERAL:  He is awake, alert and oriented with slow intellect. HEENT:  Head is normocephalic and atraumatic.  Eyes, PERRLA.  Resides with more to the right when speaking. NECK:  Supple without carotid bruit.  Mild JVD is noted.  No thyromegaly. CARDIOVASCULAR:  Irregular  rate and rhythm, tachycardic with 1/6 systolic murmur.  Pulses are 2+ and equal. LUNGS:  Bibasilar diminished with bilateral crackles.  No wheezes are noted. ABDOMEN:  Soft, obese and nontender with no distention noted. EXTREMITIES:  Without clubbing, cyanosis or edema. NEURO:  Cranial nerves II through XII are grossly intact.  IMPRESSION: 1. Atrial flutter with rapid ventricular response of uncertain     duration.  [place] score is 4 for hypertension,     cerebrovascular accident and congestive heart failure. Coumadin therapy initiated.     Echocardiogram has been ordered.  We will add metoprolol for better rate control.      [facility] records requested and lipid profile ordered.  2. Hypertension currently controlled on Cardizem drip without other     antihypertensives. 3. Thyroid disease.  We will check TSH.    Sean Hardy. Sean Bishop, NP   ______________________________ Gerrit Friends. Dietrich Pates, MD, [facility]    KML/MEDQ  D:  10/25/2010  T:  10/25/2010  Job:  [id number]  cc:   Gerrit Friends. Dietrich Pates, MD, [facility] [address] [place], [place] [address]  Mickle Plumb, M.D.  Electronically Signed by Joni Reining NP on 11/08/2010 01:05:02 PM Electronically Signed by [place] Bing MD [facility] on 11/25/2010 05:10:10 PM

## 2010-12-02 ENCOUNTER — Other Ambulatory Visit: Payer: Self-pay | Admitting: Adult Health

## 2010-12-02 ENCOUNTER — Other Ambulatory Visit: Payer: Self-pay | Admitting: Cardiology

## 2010-12-02 ENCOUNTER — Other Ambulatory Visit: Payer: Self-pay | Admitting: *Deleted

## 2010-12-02 DIAGNOSIS — Z79899 Other long term (current) drug therapy: Secondary | ICD-10-CM

## 2010-12-03 ENCOUNTER — Encounter: Payer: Self-pay | Admitting: *Deleted

## 2010-12-03 LAB — COMPREHENSIVE METABOLIC PANEL
BUN: 20 mg/dL (ref 6–23)
CO2: 22 mEq/L (ref 19–32)
Calcium: 10 mg/dL (ref 8.4–10.5)
Chloride: 108 mEq/L (ref 96–112)
Creat: 1.21 mg/dL (ref 0.50–1.35)
Glucose, Bld: 72 mg/dL (ref 70–99)

## 2010-12-10 ENCOUNTER — Encounter: Payer: Self-pay | Admitting: *Deleted

## 2010-12-10 ENCOUNTER — Ambulatory Visit: Payer: Medicare Other | Admitting: Cardiology

## 2010-12-10 ENCOUNTER — Encounter: Payer: Self-pay | Admitting: Cardiology

## 2010-12-10 DIAGNOSIS — E039 Hypothyroidism, unspecified: Secondary | ICD-10-CM | POA: Insufficient documentation

## 2010-12-10 DIAGNOSIS — Z8673 Personal history of transient ischemic attack (TIA), and cerebral infarction without residual deficits: Secondary | ICD-10-CM | POA: Insufficient documentation

## 2010-12-17 ENCOUNTER — Ambulatory Visit (INDEPENDENT_AMBULATORY_CARE_PROVIDER_SITE_OTHER): Payer: Medicare Other | Admitting: Cardiology

## 2010-12-17 ENCOUNTER — Encounter: Payer: Self-pay | Admitting: Cardiology

## 2010-12-17 DIAGNOSIS — I10 Essential (primary) hypertension: Secondary | ICD-10-CM

## 2010-12-17 DIAGNOSIS — I513 Intracardiac thrombosis, not elsewhere classified: Secondary | ICD-10-CM

## 2010-12-17 DIAGNOSIS — I5189 Other ill-defined heart diseases: Secondary | ICD-10-CM

## 2010-12-17 DIAGNOSIS — Z720 Tobacco use: Secondary | ICD-10-CM

## 2010-12-17 DIAGNOSIS — M109 Gout, unspecified: Secondary | ICD-10-CM | POA: Insufficient documentation

## 2010-12-17 DIAGNOSIS — I428 Other cardiomyopathies: Secondary | ICD-10-CM | POA: Insufficient documentation

## 2010-12-17 DIAGNOSIS — I4892 Unspecified atrial flutter: Secondary | ICD-10-CM

## 2010-12-17 DIAGNOSIS — E785 Hyperlipidemia, unspecified: Secondary | ICD-10-CM

## 2010-12-17 DIAGNOSIS — I2510 Atherosclerotic heart disease of native coronary artery without angina pectoris: Secondary | ICD-10-CM

## 2010-12-17 DIAGNOSIS — I43 Cardiomyopathy in diseases classified elsewhere: Secondary | ICD-10-CM | POA: Insufficient documentation

## 2010-12-17 DIAGNOSIS — E0590 Thyrotoxicosis, unspecified without thyrotoxic crisis or storm: Secondary | ICD-10-CM

## 2010-12-17 MED ORDER — CHLORTHALIDONE 25 MG PO TABS: 12.5000 mg | ORAL_TABLET | Freq: Every day | ORAL | Status: DC

## 2010-12-17 MED ORDER — LISINOPRIL 20 MG PO TABS: 20.0000 mg | ORAL_TABLET | Freq: Every day | ORAL | Status: DC

## 2010-12-17 MED ORDER — METOPROLOL SUCCINATE ER 25 MG PO TB24: 25.0000 mg | ORAL_TABLET | Freq: Every day | ORAL | Status: DC

## 2010-12-17 NOTE — Assessment & Plan Note (Signed)
LV function has normalized with control of arrhythmia strongly suggesting that his original CHF was tachycardia mediated.  He has a small residual segmental Galvis motion abnormality and evaluation for possible coronary disease may be prudent at some point.  He developed prerenal azotemia with moderate dose diuretics.  Furosemide will be discontinued and chlorthalidone substituted at a low dose for treatment of hypertension with monitoring of electrolytes and renal function.

## 2010-12-17 NOTE — Assessment & Plan Note (Signed)
Patient has been maintained on aspirin and dabigatran for treatment of thromboembolic risk and left atrial appendage thrombus.  This will likely resolve with this therapy, and repeat imaging is not warranted, especially when  Recurrent atrial fibrillation has not been documented.

## 2010-12-17 NOTE — Patient Instructions (Signed)
You must bring your medications to EVERY office visit with [place].  Your physician has recommended you make the following change in your medication: decrease Metoprolol to 25 mg daily, stop taking Digoxin, Lasix and Potassium, increase Lisinopril to 20 mg daily and start taking Chlorthalidone 12.5 mg daily  Your physician recommends that you return for lab work in: 1 month  Your physician has requested that you regularly monitor and record your blood pressure readings at home. Please use the same machine at the same time of day to check your readings and record them to bring to your follow-up visit.   Your physician recommends that you schedule a follow-up appointment in: 1 month for a blood pressure check with nurse and in 2 months with MD

## 2010-12-17 NOTE — Assessment & Plan Note (Signed)
Records from [place] have been requested.

## 2010-12-17 NOTE — Assessment & Plan Note (Signed)
Patient advised to discontinue tobacco use.  Since so much is going on at present, a major effort to achieve abstinence from cigarette smoking will be deferred.

## 2010-12-17 NOTE — Assessment & Plan Note (Addendum)
Blood pressure control is inadequate and always has been according to the patient.  Medications will be adjusted, to include an increase in his dose of lisinopril to 20 mg per day and the addition of chlorthalidone, until blood pressure control is achieved.  Since we're uncertain as to exactly what medications patient is currently utilizing, we have asked him to return with all of his medication vials.  Patient has been asked to measure blood pressure at his local pharmacy on a number of occasions prior to his next office visit.  He will return for reassessment by the cardiology nurses in one month and by me in 2 months.

## 2010-12-17 NOTE — Progress Notes (Signed)
HPI : Mr. Deerman returns to the office as scheduled for continued assessment and treatment of atrial fibrillation.  He initially presented with congestive heart failure and severe left ventricular dysfunction, but a recent echocardiogram shows a normal ejection fraction with modest inferior hypokinesis.  This is consistent with the initial suspicion of tachycardia-mediated left ventricular dysfunction.  Dyspnea has resolved.  He is noted no pedal edema nor chest discomfort; however, he does experience fatigue in warm weather.  This predated his acute illness.  He is experiencing no palpitations, no lightheadedness nor syncope.  Unfortunately, the dosage of his medications is somewhat uncertain.  He receives assistance with his medical care from his daughter, who has not accompanied him to this appointment.  Current Outpatient Prescriptions on File Prior to Visit  Medication Sig Dispense Refill  . acetaminophen (TYLENOL) 500 MG tablet Take 500 mg by mouth every 6 (six) hours as needed.        Marland Kitchen amiodarone (PACERONE) 400 MG tablet Take 0.5 tablets (200 mg total) by mouth daily.  30 tablet  3  . aspirin 81 MG tablet Take 81 mg by mouth daily.        Marland Kitchen atorvastatin (LIPITOR) 40 MG tablet Take 40 mg by mouth daily.        . dabigatran (PRADAXA) 150 MG CAPS Take 150 mg by mouth every 12 (twelve) hours.        . febuxostat (ULORIC) 40 MG tablet Take 80 mg by mouth.       . levothyroxine (SYNTHROID, LEVOTHROID) 150 MCG tablet Take 150 mcg by mouth daily.        . niacin (NIASPAN) 1000 MG CR tablet Take 1,000 mg by mouth at bedtime.        . nitroGLYCERIN (NITROSTAT) 0.4 MG SL tablet Place 0.4 mg under the tongue every 5 (five) minutes as needed.        . solifenacin (VESICARE) 5 MG tablet Take 10 mg by mouth daily.        Marland Kitchen DISCONTD: lisinopril (PRINIVIL,ZESTRIL) 5 MG tablet Take 5 mg by mouth daily.        Marland Kitchen DISCONTD: metoprolol (TOPROL-XL) 50 MG 24 hr tablet Take 50 mg by mouth daily.           Allergies   Allergen Reactions  . Penicillins       Past medical history, social history, and family history reviewed and updated.  ROS: See history of present illness.  PHYSICAL EXAM: BP 165/92  Pulse 48  Ht 6\' 1"  (1.854 m)  Wt 86.183 kg (190 lb)  BMI 25.07 kg/m2  SpO2 95%  General-Well developed; no acute distress Body habitus-proportionate weight and height Neck-No JVD; no carotid bruits Lungs-clear lung fields; resonant to percussion Cardiovascular-normal PMI; normal S1 and S2; modest systolic ejection murmur Abdomen-normal bowel sounds; soft and non-tender without masses or organomegaly Musculoskeletal-No deformities, no cyanosis or clubbing Neurologic-Normal cranial nerves; symmetric strength and tone Skin-Warm, no significant lesions Extremities-distal pulses intact; no edema  EKG: (Rhythm Strip)-sinus bradycardia rate of 44; increase in heart rate to 47 with moderate exertion.  ASSESSMENT AND PLAN:

## 2010-12-17 NOTE — Assessment & Plan Note (Addendum)
No symptoms or clinical evidence for recurrent atrial arrhythmias on amiodarone.  Unfortunately, we are not certain how much amiodarone he is taking, but it appears that his dose is only 100 mg per day instead of the 200 mg that was ordered.  If so, we will continue at the lower dose.  In light of his severe bradycardia.  His dose of metoprolol will be decreased, but ultimately will likely need to be discontinued.   A post-exercise rhythm strip demonstrated only a minimal increase in heart rate with activity to 50 bpm.   Digoxin will also be discontinued, but is probably not exacerbating sinus bradycardia.

## 2010-12-18 ENCOUNTER — Encounter: Payer: Self-pay | Admitting: Cardiology

## 2011-01-02 ENCOUNTER — Encounter: Payer: Self-pay | Admitting: *Deleted

## 2011-01-11 ENCOUNTER — Encounter: Payer: Self-pay | Admitting: Cardiology

## 2011-02-17 ENCOUNTER — Encounter: Payer: Self-pay | Admitting: Cardiology

## 2011-02-17 ENCOUNTER — Ambulatory Visit (INDEPENDENT_AMBULATORY_CARE_PROVIDER_SITE_OTHER): Payer: Medicare Other | Admitting: Cardiology

## 2011-02-17 ENCOUNTER — Other Ambulatory Visit: Payer: Self-pay

## 2011-02-17 DIAGNOSIS — Z7901 Long term (current) use of anticoagulants: Secondary | ICD-10-CM

## 2011-02-17 DIAGNOSIS — E785 Hyperlipidemia, unspecified: Secondary | ICD-10-CM

## 2011-02-17 DIAGNOSIS — I5189 Other ill-defined heart diseases: Secondary | ICD-10-CM

## 2011-02-17 DIAGNOSIS — E039 Hypothyroidism, unspecified: Secondary | ICD-10-CM

## 2011-02-17 DIAGNOSIS — I2510 Atherosclerotic heart disease of native coronary artery without angina pectoris: Secondary | ICD-10-CM

## 2011-02-17 DIAGNOSIS — Z720 Tobacco use: Secondary | ICD-10-CM

## 2011-02-17 DIAGNOSIS — I513 Intracardiac thrombosis, not elsewhere classified: Secondary | ICD-10-CM

## 2011-02-17 DIAGNOSIS — I4892 Unspecified atrial flutter: Secondary | ICD-10-CM

## 2011-02-17 DIAGNOSIS — I10 Essential (primary) hypertension: Secondary | ICD-10-CM

## 2011-02-17 NOTE — Assessment & Plan Note (Signed)
Repeat imaging for evaluation of possible persistent thrombus will not be necessary unless radiofrequency ablation is required at some time in the future.

## 2011-02-17 NOTE — Assessment & Plan Note (Signed)
No symptoms to suggest recurrence of atrial arrhythmia.  Rhythm strip documents sinus rhythm at this visit.  We will continue amiodarone at 200 mg per day with consideration of a reduction in dosage in the future.

## 2011-02-17 NOTE — Patient Instructions (Signed)
**Note De-Identified [name] Obfuscation** Your physician recommends that you return for lab work in: 5 months  A chest x-ray takes a picture of the organs and structures inside the chest, including the heart, lungs, and blood vessels. This test can show several things, including, whether the heart is enlarges; whether fluid is building up in the lungs; and whether pacemaker / defibrillator leads are still in place.  Your physician recommends that you complete 3 hemoccult cards, please follow instructions included in envelope with cards  Your physician recommends that you schedule a follow-up appointment in: 6 months

## 2011-02-17 NOTE — Assessment & Plan Note (Signed)
Patient has no signs to suggest progression of coronary disease.  We will continue to optimize treatment of cardiovascular risk factors.

## 2011-02-17 NOTE — Assessment & Plan Note (Signed)
Blood pressure control is acceptable if not optimal.  Patient has been advised to collect home blood pressures for review at his next visit.

## 2011-02-17 NOTE — Assessment & Plan Note (Signed)
TSH will be repeated in conjunction with monitoring of amiodarone therapy.  Chest x-ray and chemistry profile will be obtained as well.

## 2011-02-17 NOTE — Progress Notes (Signed)
HPI : Mr. Mcguire returns to the office for continuing assessment and treatment of atrial arrhythmias.  Since his last visit, he has done extremely well.  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  Tobacco consumption has progressively decreased.  He has had no apparent adverse effects from amiodarone therapy except for initial bradycardia, has had no chest discomfort suggesting myocardial ischemia and has had a decent blood pressure control when assessed.  Current Outpatient Prescriptions on File Prior to Visit  Medication Sig Dispense Refill  . acetaminophen (TYLENOL) 500 MG tablet Take 500 mg by mouth every 6 (six) hours as needed.        Marland Kitchen amiodarone (PACERONE) 400 MG tablet Take 0.5 tablets (200 mg total) by mouth daily.  30 tablet  3  . aspirin 81 MG tablet Take 81 mg by mouth daily.        Marland Kitchen atorvastatin (LIPITOR) 40 MG tablet Take 40 mg by mouth daily.        . chlorthalidone (HYGROTON) 25 MG tablet Take 0.5 tablets (12.5 mg total) by mouth daily.  30 tablet  3  . dabigatran (PRADAXA) 150 MG CAPS Take 150 mg by mouth every 12 (twelve) hours.        . febuxostat (ULORIC) 40 MG tablet Take 80 mg by mouth.       . levothyroxine (SYNTHROID, LEVOTHROID) 150 MCG tablet Take 150 mcg by mouth daily.        Marland Kitchen lisinopril (PRINIVIL,ZESTRIL) 20 MG tablet Take 1 tablet (20 mg total) by mouth daily. Increase in dose  30 tablet  3  . metoprolol (TOPROL-XL) 25 MG 24 hr tablet Take 1 tablet (25 mg total) by mouth daily.  30 tablet  3  . niacin (NIASPAN) 1000 MG CR tablet Take 1,000 mg by mouth at bedtime.        . nitroGLYCERIN (NITROSTAT) 0.4 MG SL tablet Place 0.4 mg under the tongue every 5 (five) minutes as needed.        . solifenacin (VESICARE) 5 MG tablet Take 10 mg by mouth daily.           Allergies  Allergen Reactions  . Penicillins       Past medical history, social history, and family history reviewed and updated.  ROS: Denies chest  discomfort, dyspnea, orthopnea, PND, lightheadedness, syncope or palpitations.  PHYSICAL EXAM: BP 144/76  Pulse 63  Ht 6\' 2"  (1.88 m)  Wt 86.637 kg (191 lb)  BMI 24.52 kg/m2  SpO2 98%  General-Well developed; no acute distress; poor dentition Body habitus-proportionate weight and height Neck-No JVD; no carotid bruits Lungs-clear lung fields; resonant to percussion Cardiovascular-normal PMI; normal S1 and S2; modest systolic ejection murmur Abdomen-normal bowel sounds; soft and non-tender without masses or organomegaly Musculoskeletal-No deformities, no cyanosis or clubbing Neurologic-Normal cranial nerves; symmetric strength and tone Skin-Warm, no significant lesions Extremities-distal pulses intact; no edema  Rhythm Strip: Sinus bradycardia at a rate of 57 bpm; sinus arrhythmia; borderline IVCD; borderline first degree AV block; no previous tracing for comparison.  ASSESSMENT AND PLAN:

## 2011-02-17 NOTE — Assessment & Plan Note (Signed)
Patient's consumption is down to 6 cigarettes per day; hopefully, he will be able to stop entirely.

## 2011-03-26 NOTE — Progress Notes (Signed)
Pt. states he lost hemoccult cards. Pt. advised that 3 more cards have been mailed to his home./LV

## 2011-09-03 ENCOUNTER — Other Ambulatory Visit: Payer: Self-pay | Admitting: Adult Health

## 2012-10-27 ENCOUNTER — Ambulatory Visit (INDEPENDENT_AMBULATORY_CARE_PROVIDER_SITE_OTHER): Payer: Medicare Other | Admitting: Cardiology

## 2012-10-27 ENCOUNTER — Encounter: Payer: Self-pay | Admitting: Cardiology

## 2012-10-27 VITALS — BP 135/86 | HR 55 | Ht 74.0 in | Wt 228.8 lb

## 2012-10-27 DIAGNOSIS — I4892 Unspecified atrial flutter: Secondary | ICD-10-CM

## 2012-10-27 DIAGNOSIS — I2510 Atherosclerotic heart disease of native coronary artery without angina pectoris: Secondary | ICD-10-CM

## 2012-10-27 DIAGNOSIS — F17200 Nicotine dependence, unspecified, uncomplicated: Secondary | ICD-10-CM

## 2012-10-27 DIAGNOSIS — Z720 Tobacco use: Secondary | ICD-10-CM

## 2012-10-27 DIAGNOSIS — E039 Hypothyroidism, unspecified: Secondary | ICD-10-CM

## 2012-10-27 DIAGNOSIS — N200 Calculus of kidney: Secondary | ICD-10-CM

## 2012-10-27 DIAGNOSIS — I7090 Unspecified atherosclerosis: Secondary | ICD-10-CM

## 2012-10-27 DIAGNOSIS — I10 Essential (primary) hypertension: Secondary | ICD-10-CM

## 2012-10-27 NOTE — Patient Instructions (Addendum)
Chest x-ray  LABS:  CMET & TSH  Your physician wants you to follow-up in: 6 months.  You will receive a reminder letter in the mail two months in advance. If you don't receive a letter, please call our office to schedule the follow-up appointment.

## 2012-10-27 NOTE — Progress Notes (Signed)
Patient ID: Sean Hardy, male   DOB: 06-27-1950, 62 y.o.   MRN: [id number]  HPI: Schedule return visit for continued assessment and treatment of atrial arrhythmias and coronary artery disease. Since patient's last visit, he has been found to be legally incapable of managing his affairs and has had professional guardians appointed by the court.  This has apparently resulted in improvement in medical compliance and in general health status. He has recently had hematuria from recurrent nephrolithiasis and likely will undergo cystoscopy and an extraction procedure in the near future. He has noted no palpitations nor other symptoms with daily activities. Current Outpatient Prescriptions  Medication Sig Dispense Refill  . amiodarone (PACERONE) 200 MG tablet TAKE ONE TABLET BY MOUTH EVERY DAY  30 tablet  6  . amLODipine (NORVASC) 5 MG tablet Take 5 mg by mouth daily.      Marland Kitchen aspirin 81 MG tablet Take 81 mg by mouth daily.        . Aspirin-Acetaminophen-Caffeine (EXCEDRIN PO) Take by mouth.      Marland Kitchen atorvastatin (LIPITOR) 40 MG tablet Take 40 mg by mouth daily.        . Bismuth Subsalicylate (PEPTO-BISMOL PO) Take by mouth.      . Dextromethorphan-Guaifenesin (TUSSIN DM PO) Take by mouth.      . DM-Doxylamine-Acetaminophen (NITE TIME COLD/FLU RELIEF PO) Take by mouth.      Marland Kitchen lisinopril (PRINIVIL,ZESTRIL) 20 MG tablet Take 1 tablet (20 mg total) by mouth daily. Increase in dose  30 tablet  3  . metoprolol succinate (TOPROL-XL) 25 MG 24 hr tablet Take 50 mg by mouth daily.      . nitroGLYCERIN (NITROSTAT) 0.4 MG SL tablet Place 0.4 mg under the tongue every 5 (five) minutes as needed.        . solifenacin (VESICARE) 5 MG tablet Take 10 mg by mouth daily.        . tamsulosin (FLOMAX) 0.4 MG CAPS Take by mouth daily.       No current facility-administered medications for this visit.   Allergies  Allergen Reactions  . Penicillins      Past medical history, social history, and family history reviewed and  updated.  ROS:  Denies chest pain, dyspnea, pedal edema, lightheadedness or syncope. All other systems reviewed and are negative.  PHYSICAL EXAM: BP 135/86  Pulse 55  Ht 6\' 2"  (1.88 m)  Wt 103.783 kg (228 lb 12.8 oz)  BMI 29.36 kg/m2;  Body mass index is 29.36 kg/(m^2). General-Well developed; no acute distress Body habitus- mildly overweight  Neck-No JVD; no carotid bruits Lungs-clear lung fields; resonant to percussion Cardiovascular-normal PMI; normal S1 and S2 Abdomen-normal bowel sounds; soft and non-tender without masses or organomegaly Musculoskeletal-No deformities, no cyanosis or clubbing Neurologic-Normal cranial nerves; symmetric strength and tone Skin-Warm, no significant lesions Extremities-distal pulses intact; no edema  EKG: Normal sinus rhythm with sinus arrhythmia; first-degree AV block; nonspecific ST-T wave abnormality; no previous tracing for comparison.  Geiger Bing, MD 10/27/2012  2:49 PM  ASSESSMENT AND PLAN

## 2012-10-28 ENCOUNTER — Encounter: Payer: Self-pay | Admitting: Cardiology

## 2012-10-28 DIAGNOSIS — N200 Calculus of kidney: Secondary | ICD-10-CM | POA: Insufficient documentation

## 2012-10-28 NOTE — Assessment & Plan Note (Signed)
Excellent control of blood pressure in recent years; current therapy will be continued.

## 2012-10-28 NOTE — Assessment & Plan Note (Signed)
Patient's caregivers are limiting his tobacco consumption. At current levels, harm is likely minimal.

## 2012-10-28 NOTE — Assessment & Plan Note (Signed)
No recent assessment of thyroid status, which is particularly important in the setting of amiodarone treatment. TSH will be measured.

## 2012-10-28 NOTE — Assessment & Plan Note (Signed)
We have been unable to locate specific information about patient's history of coronary disease or percutaneous intervention. For now, he is asymptomatic with generally good control of cardiovascular risk factors.

## 2012-10-28 NOTE — Assessment & Plan Note (Addendum)
Atrial arrhythmias clinically controlled with amiodarone. Appropriate monitoring studies will be obtained.  In the absence of documented recurrence of arrhythmia and as the result of patient's previous medical noncompliance, full anticoagulation has been deferred.  Although with novel oral anticoagulants and improved supervision of his care, he could now be anticoagulated with reasonable safety, I will not plan to do so unless recurrent atrial fibrillation or flutter is documented.

## 2012-11-02 LAB — COMPREHENSIVE METABOLIC PANEL
Albumin: 4.2 g/dL (ref 3.5–5.2)
BUN: 24 mg/dL — ABNORMAL HIGH (ref 6–23)
CO2: 24 mEq/L (ref 19–32)
Glucose, Bld: 89 mg/dL (ref 70–99)
Sodium: 137 mEq/L (ref 135–145)
Total Bilirubin: 0.5 mg/dL (ref 0.3–1.2)
Total Protein: 6.7 g/dL (ref 6.0–8.3)

## 2012-11-02 LAB — TSH: TSH: 3.824 u[IU]/mL (ref 0.350–4.500)

## 2012-11-05 ENCOUNTER — Encounter: Payer: Self-pay | Admitting: *Deleted

## 2012-11-05 ENCOUNTER — Encounter: Payer: Self-pay | Admitting: Cardiology

## 2012-11-05 DIAGNOSIS — N183 Chronic kidney disease, stage 3 (moderate): Secondary | ICD-10-CM

## 2012-11-09 ENCOUNTER — Telehealth: Payer: Self-pay | Admitting: *Deleted

## 2012-11-09 NOTE — Telephone Encounter (Signed)
PT NEEDS TO COME OFF PRADAXA FOR 7 DAYS. FOR LIPITRIPSY PROCEDURE.

## 2012-11-09 NOTE — Telephone Encounter (Signed)
Request medication therapy change noted via incoming fax, given to Dr RR for review, will advise once received

## 2012-11-15 NOTE — Telephone Encounter (Signed)
Incoming fax placed in Dr Macarthur Critchley folder for review, will fax once completed,lvm for pt to call office with any further assistance if needed

## 2012-11-18 ENCOUNTER — Encounter: Payer: Self-pay | Admitting: Cardiology

## 2012-11-18 NOTE — Telephone Encounter (Signed)
Faxed notation to [employer]

## 2012-11-18 NOTE — Telephone Encounter (Addendum)
Patient may discontinue Pradaxa 48 hours prior to procedure and resume 24 hours after procedure.  Fax a copy of this note to Dr. Wendall Stade.

## 2012-11-25 ENCOUNTER — Telehealth: Payer: Self-pay | Admitting: *Deleted

## 2012-11-25 NOTE — Telephone Encounter (Signed)
Please advise 

## 2012-11-25 NOTE — Telephone Encounter (Signed)
Dr Tenny Craw gave verbal instructions to advise pt to Stop taking the medication Pradaxa completely, called pt facility and spoke to [employer] whom advised she will need the order in writing, manually faxed signed order to fax number [phone], advised to call office with any further assistance if needed

## 2012-11-25 NOTE — Telephone Encounter (Signed)
Message copied by Ovidio Kin on Thu Nov 25, 2012  9:40 AM ------      Message from: San Jetty      Created: Thu Nov 25, 2012  9:17 AM       HEY THIS PATIENT WAS APPROVED TO COME OFF PRADAXA FOR 48 HR BEFORE PROCEDURE, BUT THEY NEED HIM TO CLEARED FOR AT LEAST 5 DAY, THEY HAD REQUESTED 7 DAY. PT IS URINATING PURE BLOOD.            THIS NEEDS ADDRESSED TODAY IF YOU WOULD LET DR ROSS MAKE THAT DECISION. DR Dietrich Pates WILL BE ON VACATION ALL NEXT WEEK AS WELL. THANKS ------

## 2012-12-21 DIAGNOSIS — I2510 Atherosclerotic heart disease of native coronary artery without angina pectoris: Secondary | ICD-10-CM

## 2012-12-30 DIAGNOSIS — R079 Chest pain, unspecified: Secondary | ICD-10-CM

## 2013-05-03 ENCOUNTER — Encounter: Payer: Medicare Other | Admitting: Cardiology

## 2013-05-03 ENCOUNTER — Encounter: Payer: Self-pay | Admitting: Cardiology

## 2013-05-03 NOTE — Progress Notes (Signed)
Clinical Summary Sean Hardy is a 62 y.o.male former patient of Dr Dietrich Pates, this is our first visit together. He was seen for the following medical problems  1. CAD - history of prior stenting remotely in the past - 10/2010 LVEF 55%, hypokinesis basal mid inferoseptal.   2. Legally incompetent  3. HTN  4. Aflutter    Past Medical History  Diagnosis Date  . Arteriosclerotic cardiovascular disease (ASCVD) 2006    Stents to unknown arteries by cardiologist at [facility] ; echo in 2006-normal EF, moderate LVH, mild inferior hypokinesis  . Hypertension   . 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 Hypothyroidism   . Atrial flutter 2012    Left atrial appendage thrombus with SEC; converted on amiodarone to marked SB  . Cardiomyopathy 2012    CHF in 10/2010; possibly tachycardia mediated; EF of 20-25%  . Chronic anticoagulation 2012    Discontinued  . Tobacco abuse     40 pack years; Tapering use; 10/2012:1/5 pack per day  . Gout   . Benign prostatic hypertrophy   . Nephrolithiasis     Lithotripsy; left ureteral calculus in 10/2012     Allergies  Allergen Reactions  . Penicillins      Current Outpatient Prescriptions  Medication Sig Dispense Refill  . amiodarone (PACERONE) 200 MG tablet TAKE ONE TABLET BY MOUTH EVERY DAY  30 tablet  6  . amLODipine (NORVASC) 5 MG tablet Take 5 mg by mouth daily.      Marland Kitchen aspirin 81 MG tablet Take 81 mg by mouth daily.        . Aspirin-Acetaminophen-Caffeine (EXCEDRIN PO) Take by mouth.      Marland Kitchen atorvastatin (LIPITOR) 40 MG tablet Take 40 mg by mouth daily.        . Bismuth Subsalicylate (PEPTO-BISMOL PO) Take by mouth.      . Dextromethorphan-Guaifenesin (TUSSIN DM PO) Take by mouth.      . DM-Doxylamine-Acetaminophen (NITE TIME COLD/FLU RELIEF PO) Take by mouth.      Marland Kitchen lisinopril (PRINIVIL,ZESTRIL) 20 MG tablet Take 1 tablet (20 mg total) by mouth daily. Increase in dose  30 tablet  3  . metoprolol  succinate (TOPROL-XL) 25 MG 24 hr tablet Take 50 mg by mouth daily.      . nitroGLYCERIN (NITROSTAT) 0.4 MG SL tablet Place 0.4 mg under the tongue every 5 (five) minutes as needed.        . solifenacin (VESICARE) 5 MG tablet Take 10 mg by mouth daily.        . tamsulosin (FLOMAX) 0.4 MG CAPS Take by mouth daily.       No current facility-administered medications for this visit.     Past Surgical History  Procedure Laterality Date  . Lithotripsy       Allergies  Allergen Reactions  . Penicillins       Family History  Problem Relation Age of Onset  . Dementia Mother   . Dementia Father      Social History Sean Hardy reports that he has been smoking Cigarettes.  He has a 20 pack-year smoking history. He has never used smokeless tobacco. Sean Hardy reports that he drinks about 1.0 ounces of alcohol per week.   Review of Systems CONSTITUTIONAL: No weight loss, fever, chills, weakness or fatigue.  HEENT: Eyes: No visual loss, blurred vision, double vision or yellow sclerae.No hearing loss, sneezing, congestion, runny nose or sore throat.  SKIN: No rash or itching.  CARDIOVASCULAR:  RESPIRATORY: No shortness of breath, cough or sputum.  GASTROINTESTINAL: No anorexia, nausea, vomiting or diarrhea. No abdominal pain or blood.  GENITOURINARY: No burning on urination, no polyuria NEUROLOGICAL: No headache, dizziness, syncope, paralysis, ataxia, numbness or tingling in the extremities. No change in bowel or bladder control.  MUSCULOSKELETAL: No muscle, back pain, joint pain or stiffness.  LYMPHATICS: No enlarged nodes. No history of splenectomy.  PSYCHIATRIC: No history of depression or anxiety.  ENDOCRINOLOGIC: No reports of sweating, cold or heat intolerance. No polyuria or polydipsia.  Marland Kitchen   Physical Examination There were no vitals filed for this visit. There were no vitals filed for this visit.  Gen: resting comfortably, no acute distress HEENT: no scleral icterus, pupils  equal round and reactive, no palptable cervical adenopathy,  CV Resp: Clear to auscultation bilaterally GI: abdomen is soft, non-tender, non-distended, normal bowel sounds, no hepatosplenomegaly MSK: extremities are warm, no edema.  Skin: warm, no rash Neuro:  no focal deficits Psych: appropriate affect   Diagnostic Studies     Assessment and Plan        Antoine Poche, M.D., F.A.C.C.

## 2013-05-25 ENCOUNTER — Encounter: Payer: Self-pay | Admitting: *Deleted

## 2013-08-11 ENCOUNTER — Encounter: Payer: Self-pay | Admitting: *Deleted

## 2013-09-14 ENCOUNTER — Non-Acute Institutional Stay (SKILLED_NURSING_FACILITY): Payer: Medicare Other | Admitting: Internal Medicine

## 2013-09-14 DIAGNOSIS — I4891 Unspecified atrial fibrillation: Secondary | ICD-10-CM

## 2013-09-14 DIAGNOSIS — I69959 Hemiplegia and hemiparesis following unspecified cerebrovascular disease affecting unspecified side: Secondary | ICD-10-CM

## 2013-09-14 DIAGNOSIS — I10 Essential (primary) hypertension: Secondary | ICD-10-CM

## 2013-09-19 NOTE — Progress Notes (Addendum)
Patient ID: Sean BoatmanDanny L Stoklosa, male   DOB: 05/30/1950, 63 y.o.   MRN: [id number]                  HISTORY & PHYSICAL  DATE:  09/14/2013    [facility]    LEVEL OF CARE:   SNF   CHIEF COMPLAINT:  Admission to SNF, post stay at [facility], exact dates uncertain.    HISTORY OF PRESENT ILLNESS:  This is a 63 year-old man who  apparently lives in a group home.  The patient states this is in [place] and he has been there the past year.  Prior to that, he was at [facility] assisted living.  I am not really certain about his functional level.    He was admitted to hospital with dizziness and elevated blood pressure.  He was monitored and amlodipine was added.  He was still felt to be too unsteady to return to his independent setting.  He was seen by ENT.    PAST MEDICAL HISTORY/PROBLEM LIST:  Includes:    Accelerated hypertension.    Bradyarrhythmia.    Dehydration.       Uncontrolled hypertension.    Vertigo.    BPH.    Anxiety.    Tinnitus.    Hypothyroidism.    Gastroesophageal reflux disease.    Glaucoma.     CURRENT MEDICATIONS:  Discharge medications include:      Amlodipine 5 q.d.    ASA 81 q.d.    Lipitor 40 q.d.    VESIcare 5 mg at bedtime.    Levoxyl 137 mg daily.     Flomax 0.4 q.d.    Metoprolol 25 daily.  I am not sure of which variant.    Lisinopril 40 q.d.    Prevacid 30 q.d.    Meclizine q.8 p.r.n. dizziness.    Valium 5 mg p.o. q.6 h p.r.n. anxiety.    Diclofenac 75 mg p.o. p.r.n. mild pain.    Nitroglycerin for chest pain.    SOCIAL HISTORY:   HOUSING:  The patient lives in a group home as noted.  Prior to this, he was in assisted living.   TOBACCO USE:   Tells me he quit smoking a year ago.   FUNCTIONAL STATUS:   His exact functional level is not clear totally.     REVIEW OF SYSTEMS:   GENERAL:   The patient tells me he has had "two strokes".   CHEST/RESPIRATORY:  No cough.  No sputum.  No shortness of breath.     CARDIAC:   No chest pain.   GI:  No nausea,  vomiting or abdominal pain.   GU:  Says he is continent of urine.    PHYSICAL EXAMINATION:   GENERAL APPEARANCE:  Pleasant man.  Not in any distress.    CHEST/RESPIRATORY:  Clear air entry bilaterally.   CARDIOVASCULAR:  CARDIAC:   Heart sounds are normal.  There are no murmurs.  No carotid bruits.   GASTROINTESTINAL:  ABDOMEN:   Mildly distended.   LIVER/SPLEEN/KIDNEYS:  No liver, no spleen.   GENITOURINARY:  BLADDER:   No overt bladder distention, although he is up in the chair.   NEUROLOGICAL:    DEEP TENDON REFLEXES:  He has mild left pronator drift, a left Hoffmann's reflex, and some hyperreflexia on the left side.   PSYCHIATRIC:   MENTAL STATUS:   He is able to answer all my basic questions.  I note he has a DSS  guardian.  He seems fairly function.  I do not have a major problem with him returning to a group home level.    ASSESSMENT/PLAN:  Accelerated hypertension.  We will need to monitor this carefully while he is here.    History of paroxysmal atrial fibrillation.  Currently, his heart rate is 56.  He is on metoprolol.  I am not certain that he is just meant to be on the regular variant of this though I think that is what he is on currently.  He is also on Norvasc 5 and lisinopril 40.  His electrolytes will need to be checked.     Hyperlipidemia.  On Lipitor.    Hypothyroidism.  On replacement, 137 mcg.    Gastroesophageal reflux disease.   On Prevacid.    Late-effect CVA.  He clearly has had a right hemisphere CVA in the past, although he has made a good recovery.   Again, I have absolutely no information on this.  He is on aspirin 81 mg.     Urinary frequency.  On VESIcare.       ADDENDUM:  Hopefully, there is something on [facility] this [facility].  He is here for rehabilitation, which  apparently was not felt able to be managed in a group home setting.   He seems fairly stable at the moment, though.  ?psychosocial  issues

## 2013-10-21 NOTE — Progress Notes (Signed)
This encounter was created in error - please disregard.

## 2013-11-03 ENCOUNTER — Encounter: Payer: Self-pay | Admitting: *Deleted

## 2013-11-07 ENCOUNTER — Encounter: Payer: Self-pay | Admitting: Neurology

## 2013-11-07 ENCOUNTER — Ambulatory Visit (INDEPENDENT_AMBULATORY_CARE_PROVIDER_SITE_OTHER): Payer: Medicare Other | Admitting: Neurology

## 2013-11-07 VITALS — BP 124/72 | HR 70 | Temp 97.6°F | Resp 18 | Ht 73.0 in | Wt 219.7 lb

## 2013-11-07 DIAGNOSIS — I6350 Cerebral infarction due to unspecified occlusion or stenosis of unspecified cerebral artery: Secondary | ICD-10-CM

## 2013-11-07 DIAGNOSIS — I639 Cerebral infarction, unspecified: Secondary | ICD-10-CM

## 2013-11-07 NOTE — Progress Notes (Signed)
NEUROLOGY CONSULTATION NOTE  Sean Hardy MRN: [id number] DOB: 1950-08-20  Referring provider: Dr. Sherryll BurgerShah Primary care provider: Dr. Sherryll BurgerShah  Reason for consult:  stroke  HISTORY OF PRESENT ILLNESS: Sean Hardy is a 63 year old left-handed man with history of prior stroke, hypertension, arrhythmia, hypothyroidism, GERD, vertigo, tinnitus, former smoker (quit in 2014) and anxiety who presents for stroke.  Unfortunately, I do not have any notes from this hospitalization.  He is accompanied by someone at his group home.  In late April-early May 2015, he was admitted [facility]. He presented with symptoms of lightheadedness and poor balance. He then noted double vision, slurred speech, and left-sided weakness. There was no associated numbness. At baseline he has some residual left-sided weakness due to a prior stroke. He lives in a functional group home. MRI reportedly verified in acute infarct. He underwent a stroke workup, results not available to me at this time. He was also found to have very elevated blood pressure. He had already been on aspirin 81 mg daily and Plavix was then added. He was subsequently discharged to physical therapy. He also currently takes Lipitor 40 mg daily.  PAST MEDICAL HISTORY: Past Medical History  Diagnosis Date  . Arteriosclerotic cardiovascular disease (ASCVD) 2006    Stents to unknown arteries [facility] ; echo in 2006-normal EF, moderate LVH, mild inferior hypokinesis  . Hypertension   . 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 Hypothyroidism   . Atrial flutter 2012    Left atrial appendage thrombus with SEC; converted on amiodarone to marked SB  . Cardiomyopathy 2012    CHF in 10/2010; possibly tachycardia mediated; EF of 20-25%  . Chronic anticoagulation 2012    Discontinued  . Tobacco abuse     40 pack years; Tapering use; 10/2012:1/5 pack per day  . Gout   . Benign prostatic  hypertrophy   . Nephrolithiasis     Lithotripsy; left ureteral calculus in 10/2012    PAST SURGICAL HISTORY: Past Surgical History  Procedure Laterality Date  . Lithotripsy      MEDICATIONS: Current Outpatient Prescriptions on File Prior to Visit  Medication Sig Dispense Refill  . amiodarone (PACERONE) 200 MG tablet TAKE ONE TABLET BY MOUTH EVERY DAY  30 tablet  6  . amLODipine (NORVASC) 5 MG tablet Take 5 mg by mouth daily.      Marland Kitchen. aspirin 81 MG tablet Take 81 mg by mouth daily.        . Aspirin-Acetaminophen-Caffeine (EXCEDRIN PO) Take by mouth.      Marland Kitchen. atorvastatin (LIPITOR) 40 MG tablet Take 40 mg by mouth daily.        . Bismuth Subsalicylate (PEPTO-BISMOL PO) Take by mouth.      . Dextromethorphan-Guaifenesin (TUSSIN DM PO) Take by mouth.      . DM-Doxylamine-Acetaminophen (NITE TIME COLD/FLU RELIEF PO) Take by mouth.      Marland Kitchen. lisinopril (PRINIVIL,ZESTRIL) 20 MG tablet Take 1 tablet (20 mg total) by mouth daily. Increase in dose  30 tablet  3  . metoprolol succinate (TOPROL-XL) 25 MG 24 hr tablet Take 50 mg by mouth daily.      . nitroGLYCERIN (NITROSTAT) 0.4 MG SL tablet Place 0.4 mg under the tongue every 5 (five) minutes as needed.        . solifenacin (VESICARE) 5 MG tablet Take 10 mg by mouth daily.        .Marland Kitchen  tamsulosin (FLOMAX) 0.4 MG CAPS Take by mouth daily.       No current facility-administered medications on file prior to visit.    ALLERGIES: Allergies  Allergen Reactions  . Penicillins     FAMILY HISTORY: Family History  Problem Relation Age of Onset  . Dementia Mother   . Dementia Father     SOCIAL HISTORY: History   Social History  . Marital Status: Widowed    Spouse Name: N/A    Number of Children: 1  . Years of Education: N/A   Occupational History  . Estate agentorklift Operator     Previous  . Disabled    Social History Main Topics  . Smoking status: Current Every Day Smoker -- 0.50 packs/day for 40 years    Types: Cigarettes  . Smokeless tobacco:  Never Used     Comment: 10/2012:1/5 pack per day  . Alcohol Use: 1.0 oz/week    2 drink(s) per week  . Drug Use: No  . Sexual Activity: Not on file   Other Topics Concern  . Not on file   Social History Narrative   Patient adjudged not competent to manage his affairs in 09/2011; now resides with professional guardians.    REVIEW OF SYSTEMS: Constitutional: No fevers, chills, or sweats, no generalized fatigue, change in appetite Eyes: No visual changes, double vision, eye pain Ear, nose and throat: No hearing loss, ear pain, nasal congestion, sore throat Cardiovascular: No chest pain, palpitations Respiratory:  No shortness of breath at rest or with exertion, wheezes GastrointestinaI: No nausea, vomiting, diarrhea, abdominal pain, fecal incontinence Genitourinary:  No dysuria, urinary retention or frequency Musculoskeletal:  No neck pain, back pain Integumentary: No rash, pruritus, skin lesions Neurological: as above Psychiatric: No depression, insomnia, anxiety Endocrine: No palpitations, fatigue, diaphoresis, mood swings, change in appetite, change in weight, increased thirst Hematologic/Lymphatic:  No anemia, purpura, petechiae. Allergic/Immunologic: no itchy/runny eyes, nasal congestion, recent allergic reactions, rashes  PHYSICAL EXAM: Filed Vitals:   11/07/13 0834  BP: 124/72  Pulse: 70  Temp: 97.6 F (36.4 C)  Resp: 18   General: No acute distress Head:  Normocephalic/atraumatic Neck: supple, no paraspinal tenderness, full range of motion Back: No paraspinal tenderness Heart: regular rate and rhythm Lungs: Clear to auscultation bilaterally. Vascular: No carotid bruits. Neurological Exam: Mental status: alert and oriented to person, place, and time, recent and remote memory intact, fund of knowledge intact, attention and concentration intact, speech fluent and not dysarthric, language intact. Cranial nerves: CN I: not tested CN II: pupils equal, round and reactive  to light, visual fields intact, fundi unremarkable, without vessel changes, exudates, hemorrhages or papilledema. CN III, IV, VI:  full range of motion, no nystagmus, no ptosis CN V: facial sensation intact CN VII: upper and lower face symmetric CN VIII: hearing intact CN IX, X: gag intact, uvula midline CN XI: sternocleidomastoid and trapezius muscles intact CN XII: tongue midline Bulk & Tone: normal, no fasciculations. Motor: 5 minus/5 left deltoid, 4+/5 right biceps, 5 minus/5 triceps and grip. Otherwise 5/5 throughout. Sensation: Pinprick and vibration intact. Deep Tendon Reflexes: 3+ left patellar, otherwise 2+ throughout. Toes downgoing. Finger to nose testing: Mild left dysmetria. Heel to shin: No dysmetria. Gait: He walks with a mild left lower extremity limp. Some difficulty with tandem walking. Romberg negative.  IMPRESSION: Ischemic cerebrovascular accident  PLAN: 1. Plavix 75 mg daily in addition to aspirin 81 mg daily. 2. Lipitor 40 mg daily (LDL goal should be less than 100). 3. Optimize  blood pressure control. 4. Will obtain records for [facility] and [facility]. Will review tests performed, such as carotid Dopplers, 2-D echo, fasting lipid panel, and hemoglobin A1c. Will order further testing or suggest change in management based on these results. 5. Continue home exercise. Suggest Mediterranean diet. 6. Followup in 3 months.  Thank you for allowing me to take part in the care of this patient.  Shon Millet, DO  CC:  Kirstie Peri, MD

## 2013-11-07 NOTE — Patient Instructions (Addendum)
1.  Continue Plavix 75mg  daily and aspirin 81mg  daily 2.  Blood pressure control 3.  We will get records from the hospital to see what tests still need to be done. 4.  Mediterranean diet. 5.  Follow up in 3 months.

## 2014-02-06 ENCOUNTER — Ambulatory Visit: Payer: Medicare Other | Admitting: Neurology

## 2014-02-06 ENCOUNTER — Telehealth: Payer: Self-pay | Admitting: Neurology

## 2014-02-06 NOTE — Telephone Encounter (Signed)
Pt no showed 3 month follow up appt w/ Dr. Everlena Cooper.   Alcario Drought - please send no show letter  Sean Hardy S.

## 2014-02-08 ENCOUNTER — Encounter: Payer: Self-pay | Admitting: *Deleted

## 2014-02-08 NOTE — Progress Notes (Signed)
No show letter sent for 02/06/2014

## 2014-07-12 ENCOUNTER — Non-Acute Institutional Stay (SKILLED_NURSING_FACILITY): Payer: Medicare Other | Admitting: Internal Medicine

## 2014-07-12 DIAGNOSIS — I481 Persistent atrial fibrillation: Secondary | ICD-10-CM

## 2014-07-12 DIAGNOSIS — E038 Other specified hypothyroidism: Secondary | ICD-10-CM

## 2014-07-12 DIAGNOSIS — I10 Essential (primary) hypertension: Secondary | ICD-10-CM

## 2014-07-12 DIAGNOSIS — I4819 Other persistent atrial fibrillation: Secondary | ICD-10-CM

## 2014-07-17 NOTE — Progress Notes (Signed)
Patient ID: Sean Hardy, male   DOB: 1950-08-04, 64 y.o.   MRN: [id number]                 HISTORY & PHYSICAL  DATE:  07/12/2014                [facility]                  LEVEL OF CARE:   SNF   CHIEF COMPLAINT:  Admission to SNF, post stay at [facility], 07/03/2014 through 07/07/2014.    HISTORY OF PRESENT ILLNESS:  This is a 64 year-old man whom we had in the facility briefly in April 2015.   At that point, he had a history of accelerated hypertension and had been in [facility].  It was noted at that time that he had a history of paroxysmal atrial fibrillation, although I did not see an echocardiogram.    He apparently was recently in hospital in [place], "diagnosed with AFib", sent home on Lopressor and Cardizem.  He apparently was doing well.  However, he was subsequently noted to have low blood pressures and continued AFib.  He was readmitted for cardioversion, which was done on this occasion.  He was apparently cardioverted to sinus rhythm.    PAST MEDICAL HISTORY/PROBLEM LIST:                                 Uncontrolled hypertension.    Bradyarrhythmias.    Dehydration.    Vertigo.    BPH.    Tinnitus.    Anxiety.     Hypothyroidism.    Gastroesophageal reflux disease.    Glaucoma.    CURRENT MEDICATIONS:  Discharge medications include:    Amiodarone 400 daily.    Xarelto 20 q.d.      Lipitor 40 q.d.      Levoxyl 137 q.d.       Nitroglycerin p.r.n.      Prilosec 20 q.d.        Plavix 75 q.d.      Metoprolol 50 b.i.d.       VESIcare 5 mg q.h.s.         SOCIAL HISTORY:                   HOUSING:  The patient lives in a group home in [place].  He tells me that his primary physician wants to put him in the [facility] in [place].  I do not really understand the issue here.  I felt the same degree of uncertainly last time he was here.  He apparently has a legal guardian.  I will see if Social Work can raise any issue.  Last  time he was here, I felt he was stable to go back to a group home setting and I have the same feeling now, unless there is some issue that I am unaware of.    REVIEW OF SYSTEMS:            GENERAL:  The patient states he is concerned about his "high blood pressure", vis--vis his past history of "two strokes".   CHEST/RESPIRATORY:  No cough.  No sputum.   CARDIAC:  No chest pain.   GI:  No nausea or vomiting.    PHYSICAL EXAMINATION:   VITAL SIGNS:   BLOOD PRESSURE:  Generally running in the 130 to as high as  146 over 80 to 90 range.   PULSE:  64 with what occasionally appears to be PACs.   GENERAL APPEARANCE:  Pleasant man.  Not in any distress.   CHEST/RESPIRATORY:  Clear air entry bilaterally.   CARDIOVASCULAR:   CARDIAC:  Heart sounds are normal.  There are no murmurs.  No gallops.  No carotid bruits.   EDEMA/VARICOSITIES:   Extremities:  No edema.   ARTERIAL:  Peripheral pulses are intact.   GASTROINTESTINAL:   ABDOMEN:  Mildly distended.  Nontender.       PSYCHIATRIC:   MENTAL STATUS:  He has always seemed fairly bright to me.  Was aware that he was in [facility] to have his "heart rhythm" stabilized.   There is no depression or delirium.    ASSESSMENT/PLAN:                                   Atrial fibrillation.  Underwent cardioversion to sinus rhythm.  The tone of the [facility] makes the atrial fibrillation sound new.  It does not seem that is the case, [facility].  In any case, I do not have any more information on his heart status than this.    History of poorly controlled high blood pressure.  Right now, this appears to be stable in the 130 to mid 140s over 80 to 90 range.  I do not see any reason at this point to change this.    Hypothyroidism.  On replacement, which is the same dose that he was on last time he was here.    Now anticoagulated on Xarelto and also Plavix.  He was not on either one of these last time.  Not exactly a  settling combination of medications.  However, I may not have enough information to really make an intelligent decision here.  He is supposed to follow [facility] with [facility] in two weeks.    Gastroesophageal reflux disease.    Late-effect CVA.  He was on aspirin last time.  Now appears to be on Plavix.     Urinary frequency.  On VESIcare.     I really do not have a good sense of why it is felt so strongly that this man be discharged to a nursing home.  He is up walking, seems reasonably cognitively intact.  His vital signs are stable.  There always appears to be some concern about him returning to a group home, both this time and last time, although I really do not know what the issue is.      CPT CODE: [id number]                          ADDENDUM:  I have spoken to the social worker.  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  He has a DSS Child psychotherapistsocial worker.  Apparently, there have been issues with repetitive trips to the ER with regards to blood pressure issues, maybe some falls.  I did not really see any instability in his balance.  I really think that any degree of mental retardation this man has is fairly mild.  Nevertheless, there may be more here than any of us know.  He has apparently been cleared to stay in this building until May, although at the bedside I cannot really see why this is necessary.

## 2014-07-19 ENCOUNTER — Non-Acute Institutional Stay (SKILLED_NURSING_FACILITY): Payer: Medicare Other | Admitting: Internal Medicine

## 2014-07-19 DIAGNOSIS — I481 Persistent atrial fibrillation: Secondary | ICD-10-CM | POA: Diagnosis not present

## 2014-07-19 DIAGNOSIS — I4819 Other persistent atrial fibrillation: Secondary | ICD-10-CM

## 2014-07-19 DIAGNOSIS — E038 Other specified hypothyroidism: Secondary | ICD-10-CM

## 2014-07-23 NOTE — Progress Notes (Addendum)
Patient ID: Sean Hardy, male   DOB: 1950/06/06, 64 y.o.   MRN: [id number]                PROGRESS NOTE  DATE:  07/19/2014         FAC[facility]                LEVEL OF CARE:   SNF   Acute Visit                                   CHIEF COMPLAINT:  Follow up cardiac status, medication issues.    HISTORY OF PRESENT ILLNESS:  This is a 64 year-old man whom I admitted to the building last week.  He had been in hospital with persistent atrial fibrillation, requiring cardioversion.  He has maintained normal sinus rhythm and is on amiodarone 400 mg daily.    I was concerned last week about the combination of Xarelto and Plavix.  He has returned to his cardiologist and the instructions are to discontinue Plavix and start Xarelto, which he is already on.  There is also a question about increasing his metoprolol to 50 b.i.d. and discontinuing Cardizem.  He is not on Cardizem.    LABORATORY DATA:  Lab work has come back showing a CBC with diff that is completely normal.  His white count is 7.2, hemoglobin of 13.9.    Basic metabolic panel shows a sodium of 140, potassium of 4.2, BUN of 17, and creatinine of 1.07.    He has a TSH of 14.9.  I am not exactly sure where this came from.  He is on Levoxyl 137 mg daily.    His triglyceride level is 448.  LDL could not be calculated because of this.    REVIEW OF SYSTEMS:    CHEST/RESPIRATORY:  No shortness of breath.   CARDIAC:  No chest pain.  No palpitations.   GI:  No nausea or vomiting.  No dysphagia.  No reflux symptoms currently.    PHYSICAL EXAMINATION:   VITAL SIGNS:   RESPIRATIONS:    18 and unlabored.    PULSE:     68 and fairly regular.    BLOOD PRESSURE:  Blood pressures have been running in the 120s to 140 over 70 to 80 range.   CHEST/RESPIRATORY:  Clear air entry bilaterally.    CARDIOVASCULAR:   CARDIAC:  Heart sounds are normal.  There are no murmurs.  He appears to be euvolemic.   GASTROINTESTINAL:   ABDOMEN:   Soft.  Nontender.  No masses.       ASSESSMENT/PLAN:                            Atrial fibrillation.  He is indeed in sinus rhythm.  The brief consult note that I got from, I think, [facility] makes several changes to medications.  However, there is enough concern here that they did not have an accurate record.   He has had some nose bleeding, he tells me.  Therefore, I am going to stop the Plavix.  He is already on Xarelto.  I see no good reason to double his metoprolol.  His pulse rate is 68 and his blood pressure is reasonably well controlled.  He is not on the cardiezem which they said to discontinue    Hypothyroidism, which may be related  to amiodarone.  I am not sure.  I would need to check the history here.  I will increase his Levoxyl to 150 mg with follow-up in six weeks.

## 2014-09-18 ENCOUNTER — Non-Acute Institutional Stay (SKILLED_NURSING_FACILITY): Payer: Medicare Other | Admitting: Internal Medicine

## 2014-09-18 DIAGNOSIS — I4819 Other persistent atrial fibrillation: Secondary | ICD-10-CM

## 2014-09-18 DIAGNOSIS — I481 Persistent atrial fibrillation: Secondary | ICD-10-CM

## 2014-09-18 DIAGNOSIS — E038 Other specified hypothyroidism: Secondary | ICD-10-CM

## 2014-09-20 NOTE — Progress Notes (Addendum)
Patient ID: Sean Hardy, male   DOB: 03/10/1951, 64 y.o.   MRN: [id number]                PROGRESS NOTE  DATE:  09/18/2014                [facility]                     LEVEL OF CARE:   SNF   Routine Visit                        CHIEF COMPLAINT:  Follow up medical issues/[facility] visit.     HISTORY OF PRESENT ILLNESS:   This is a patient who transferred to [facility] earlier this month.    He had initially come here after presenting to [facility] with atrial fibrillation.  This was not well controlled, although it did not appear to be new.   He was transitioned to Xarelto from a combination of Plavix and Xarelto.  He has a prior history of stroke.      PAST MEDICAL HISTORY/PROBLEM LIST:                         Atrial fibrillation.    Hypertension.     Hypothyroidism.  On replacement.    Hyperlipidemia.    Gastroesophageal reflux disease.    Urinary frequency.      Constipation.    CURRENT MEDICATIONS:  Medication list is reviewed.                  Xarelto 20 mg daily.     Lipitor 40 q.d.       Prilosec 20 q.d.       Amiodarone 200 q.d.      Synthroid 175 q.d.       Fish oil 2 g by mouth daily.    Lopressor 50 b.i.d.       Senokot q.h.s.       VESIcare 5 mg q.d.       REVIEW OF SYSTEMS:    CHEST/RESPIRATORY:  The patient denies shortness of breath.    CARDIAC:  No chest pain.   GI:  No nausea, vomiting, or diarrhea.   MUSCULOSKELETAL:  He is complaining of left shoulder pain.       PHYSICAL EXAMINATION:   VITAL SIGNS:   RESPIRATIONS:    18 and unlabored.   PULSE:     60 and regular.     BLOOD PRESSURE:  140/76.    WEIGHT:   232 pounds.     CHEST/RESPIRATORY:  Clear air entry bilaterally.    CARDIOVASCULAR:   CARDIAC:  Heart sounds are normal.  There are no murmurs.    MUSCULOSKELETAL:   EXTREMITIES:   LEFT UPPER EXTREMITY:  He does have range of motion difficulties with abduction of the left shoulder.   I suspect this is a rotator  cuff issue.    ASSESSMENT/PLAN:                       Atrial fibrillation.  The rate is controlled here.  He is on amiodarone 200 mg, metoprolol, and anticoagulated with Xarelto.    Hypothyroidism.  On replacement.  His TSH was checked earlier this month at 12.01.  His Synthroid was increased to 175.    Hyperlipidemia.  He will need to have his fasting lipids  checked, probably in a month or two.    Hypertension.  His hypertension appears to be reasonably well controlled.     As I understand things, the patient has become a long-term patient here.  He is very independent, walks around the facility without any difficulties.

## 2014-10-23 ENCOUNTER — Non-Acute Institutional Stay (SKILLED_NURSING_FACILITY): Payer: Medicare Other | Admitting: Internal Medicine

## 2014-10-23 DIAGNOSIS — I481 Persistent atrial fibrillation: Secondary | ICD-10-CM

## 2014-10-23 DIAGNOSIS — M10072 Idiopathic gout, left ankle and foot: Secondary | ICD-10-CM | POA: Diagnosis not present

## 2014-10-23 DIAGNOSIS — E038 Other specified hypothyroidism: Secondary | ICD-10-CM | POA: Diagnosis not present

## 2014-10-23 DIAGNOSIS — M109 Gout, unspecified: Secondary | ICD-10-CM

## 2014-10-23 DIAGNOSIS — I4819 Other persistent atrial fibrillation: Secondary | ICD-10-CM

## 2014-10-23 NOTE — Progress Notes (Signed)
Patient ID: Sean Hardy, male   DOB: 1951/01/21, 64 y.o.   MRN: [id number]                 PROGRESS NOTE  DATE:  10/23/2014              [facility]                     LEVEL OF CARE:   SNF   Routine Visit                        CHIEF COMPLAINT:  Follow up medical issues/[facility] visit.     HISTORY OF PRESENT ILLNESS:   This is a patient who transferred to [facility] earlier last month.    He had initially come here after presenting to [facility] with atrial fibrillation.  This was not well controlled, although it did not appear to be new.   He was transitioned to Xarelto from a combination of Plavix and Xarelto.  He has a prior history of stroke.    Patient complained of pain in his left foot earlier this month. He apparently has a history of gout and his uric acid was elevated at 9.9 on 5/2. He was treated with colchicine and tramadol. He was also found to have an elevated TSH at 12.01 and his Synthroid was increased to. 200 note he is also on amiodarone    PAST MEDICAL HISTORY/PROBLEM LIST:                         Atrial fibrillation.    Hypertension.     Hypothyroidism.  On replacement.    Hyperlipidemia.    Gastroesophageal reflux disease.    Urinary frequency.      Constipation.    CURRENT MEDICATIONS:  Medication list is reviewed.                  Xarelto 20 mg daily.     Lipitor 40 q.d.       Prilosec 20 q.d.       Amiodarone 200 q.d.      Synthroid 200 q.d.       Fish oil 2 g by mouth daily.    Lopressor 50 b.i.d.       Senokot q.h.s.       VESIcare 5 mg q.d.   Synthroid 170 g daily    Lisinopril 2.5 by mouth daily    REVIEW OF SYSTEMS:    CHEST/RESPIRATORY:  The patient denies shortness of breath.    CARDIAC:  No chest pain.   GI:  No nausea, vomiting, or diarrhea.   MUSCULOSKELETAL:  He is complaining of left shoulder pain.       PHYSICAL EXAMINATION:   VITAL SIGNS:   RESPIRATIONS:    18 and unlabored.   PULSE:     60  and regular.     BLOOD PRESSURE:  140/76.    WEIGHT:   232 pounds.     CHEST/RESPIRATORY:  Clear air entry bilaterally.    CARDIOVASCULAR:   CARDIAC:  Heart sounds are normal.  There are no murmurs.    MUSCULOSKELETAL:   EXTREMITIES:   LEFT UPPER EXTREMITY:  He does have range of motion difficulties with abduction of the left shoulder.   I suspect this is a rotator cuff issue.    ASSESSMENT/PLAN:  Atrial fibrillation.  The rate is controlled here.  He is on amiodarone 200 mg, metoprolol, and anticoagulated with Xarelto.    Hypothyroidism.  On replacement.  His TSH was checked earlier this month at 12.01.  His Synthroid was increased to 200  Hyperlipidemia.  He will need to have his fasting lipids checked, probably in a month or two.    Hypertension.  His hypertension appears to be reasonably well controlled.   Acute gout in his left foot and ankle. He was treated with colchicine and then prednisone with improvement. He has an elevated uric acid level at 9.9. He was started on allopurinol on 5/3, and is not impossible that the allopurinol could've precipitated this he will need a follow-up uric acid level. I'll discuss this with the nurse practitioner    As I understand things, the patient has become a long-term patient here.  He is very independent, walks around the facility without any difficulties.

## 2014-12-01 ENCOUNTER — Encounter: Payer: Self-pay | Admitting: Vascular Surgery

## 2014-12-05 ENCOUNTER — Encounter: Payer: Self-pay | Admitting: Vascular Surgery

## 2014-12-05 ENCOUNTER — Ambulatory Visit (INDEPENDENT_AMBULATORY_CARE_PROVIDER_SITE_OTHER): Payer: Medicare Other | Admitting: Vascular Surgery

## 2014-12-05 VITALS — BP 172/119 | HR 73 | Ht 73.0 in | Wt 240.8 lb

## 2014-12-05 DIAGNOSIS — M7989 Other specified soft tissue disorders: Secondary | ICD-10-CM | POA: Diagnosis not present

## 2014-12-05 NOTE — Progress Notes (Signed)
Patient name: Sean Hardy MRN: [id number] DOB: 11/27/1950 Sex: male   Referred by: Elton SinBurkhart  Reason for referral:  Chief Complaint  Patient presents with  . New Evaluation    eval bilateral LE pain/swelling     HISTORY OF PRESENT ILLNESS: The patient presents today for evaluation of left leg pain and swelling. He has an extensive past history with a major stroke in the past and also with coronary disease with atrial fibrillation and prior coronary stenting. He was diagnosed with gout in his left ankle and is being treated for this. He is seen today to rule out any significant arterial or venous pathology. He does not have any history of tissue loss. He lives in a skilled nursing facility with supervision due to his prior stroke. Day with his a clinical social worker who provides most of his history.  Past Medical History  Diagnosis Date  . Arteriosclerotic cardiovascular disease (ASCVD) 2006    Stents to unknown arteries [facility] ; echo in 2006-normal EF, moderate LVH, mild inferior hypokinesis  . Hypertension   . 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 Hypothyroidism   . Atrial flutter 2012    Left atrial appendage thrombus with SEC; converted on amiodarone to marked SB  . Cardiomyopathy 2012    CHF in 10/2010; possibly tachycardia mediated; EF of 20-25%  . Chronic anticoagulation 2012    Discontinued  . Tobacco abuse     40 pack years; Tapering use; 10/2012:1/5 pack per day  . Gout   . Benign prostatic hypertrophy   . Nephrolithiasis     Lithotripsy; left ureteral calculus in 10/2012  . Stroke   . Gout   . Anxiety   . Major depressive disorder   . CHF (congestive heart failure)   . Hyperlipidemia   . Peripheral vascular disease   . Atrial fibrillation   . GERD (gastroesophageal reflux disease)   . Muscle weakness     Past Surgical History  Procedure Laterality Date  . Lithotripsy    . Cardioversion      . Coronary stent placement  2003    History   Social History  . Marital Status: Widowed    Spouse Name: N/A  . Number of Children: 1  . Years of Education: N/A   Occupational History  . Estate agentorklift Operator     Previous  . Disabled    Social History Main Topics  . Smoking status: Former Smoker -- 0.50 packs/day for 40 years    Types: Cigarettes    Quit date: 12/04/2012  . Smokeless tobacco: Never Used     Comment: 10/2012:1/5 pack per day  . Alcohol Use: No  . Drug Use: No  . Sexual Activity: Not on file   Other Topics Concern  . Not on file   Social History Narrative   Patient adjudged not competent to manage his affairs in 09/2011; now resides with professional guardians.    Family History  Problem Relation Age of Onset  . Dementia Mother   . Dementia Father     Allergies as of 12/05/2014 - Review Complete 12/05/2014  Allergen Reaction Noted  . Penicillins  11/15/2010    Current Outpatient Prescriptions on File Prior to Visit  Medication Sig Dispense Refill  . amiodarone (PACERONE) 200 MG tablet TAKE ONE TABLET BY MOUTH EVERY DAY 30 tablet 6  . atorvastatin (LIPITOR) 40 MG tablet Take 40 mg by  mouth daily.      Marland Kitchen lisinopril (PRINIVIL,ZESTRIL) 20 MG tablet Take 1 tablet (20 mg total) by mouth daily. Increase in dose 30 tablet 3  . metoprolol succinate (TOPROL-XL) 25 MG 24 hr tablet Take 50 mg by mouth daily.    . nitroGLYCERIN (NITROSTAT) 0.4 MG SL tablet Place 0.4 mg under the tongue every 5 (five) minutes as needed.      . solifenacin (VESICARE) 5 MG tablet Take 10 mg by mouth daily.      Marland Kitchen amLODipine (NORVASC) 5 MG tablet Take 5 mg by mouth daily.    Marland Kitchen aspirin 81 MG tablet Take 81 mg by mouth daily.      . Aspirin-Acetaminophen-Caffeine (EXCEDRIN PO) Take by mouth.    . Bismuth Subsalicylate (PEPTO-BISMOL PO) Take by mouth.    . clopidogrel (PLAVIX) 75 MG tablet Take 75 mg by mouth daily with breakfast.    . Dextromethorphan-Guaifenesin (TUSSIN DM PO) Take by  mouth.    . DM-Doxylamine-Acetaminophen (NITE TIME COLD/FLU RELIEF PO) Take by mouth.    . tamsulosin (FLOMAX) 0.4 MG CAPS Take by mouth daily.     No current facility-administered medications on file prior to visit.     REVIEW OF SYSTEMS: Negative except for his extensive past history   PHYSICAL EXAMINATION:  General: The patient is a well-nourished male, in no acute distress. Vital signs are BP 172/119 mmHg  Pulse 73  Ht  (1.854 m)  Wt 240 lb 12.8 oz (109.226 kg)  BMI 31.78 kg/m2  SpO2 97% Pulmonary: There is a good air exchange bilaterally without wheezing or rales. Abdomen: Soft and non-tender with normal pitch bowel sounds. No aneurysm palpable Musculoskeletal: There are no major deformities.  Does have some tenderness in his left ankle. Does have bilateral lower extremity edema with pitting edema bilaterally Neurologic: Generalized weakness. Is able to walk Skin: There are no ulcer or rashes noted. Psychiatric: The patient has normal affect. Answers questions appropriately. Cardiovascular: Irregular rate and rhythm. Pulse status 2+ radial pulses bilaterally. Does have a palpable 2+ posterior tibial on the right and 1+ posterior tibial pulse on the left Hand-held Doppler reveals strong multiphasic signals in his perineal and posterior tibial bilaterally    Vascular Lab Studies:  I reviewed his health patient venous and arterial studies from another lab. This shows no evidence of DVT bilaterally. Is arterial studies show some stenosis in his anterior tibial artery but otherwise  Impression and Plan:  No evidence of significant arterial or venous pathology to calls his left ankle swelling and pain. Reassured the patient with this. Would not recommend any further evaluation    Ashliegh Parekh [facility] Office: [phone]

## 2014-12-17 ENCOUNTER — Non-Acute Institutional Stay (SKILLED_NURSING_FACILITY): Payer: Medicare Other | Admitting: Internal Medicine

## 2014-12-17 DIAGNOSIS — I10 Essential (primary) hypertension: Secondary | ICD-10-CM

## 2014-12-17 DIAGNOSIS — I481 Persistent atrial fibrillation: Secondary | ICD-10-CM

## 2014-12-17 DIAGNOSIS — I4819 Other persistent atrial fibrillation: Secondary | ICD-10-CM

## 2014-12-17 DIAGNOSIS — E038 Other specified hypothyroidism: Secondary | ICD-10-CM

## 2014-12-20 NOTE — Progress Notes (Addendum)
Patient ID: Sean Hardy, male   DOB: Sep 24, 1950, 64 y.o.   MRN: [id number]                PROGRESS NOTE  DATE:  12/17/2014         [facility]                LEVEL OF CARE:   SNF   Routine Visit                CHIEF COMPLAINT:  Review of medical issues/[facility] visit.      HISTORY OF PRESENT ILLNESS:  This is a patient who came to [place] initially from [facility] with recurrent atrial fibrillation.  He is on Xarelto.  He has a prior history of a CVA.    Recently, he complained of pain in his left foot and ankle.  There was some suggestion that he had a history of gout with an elevated uric acid level of 9.9.    Apparently, he complained to the [facility] nurse practitioner about this.  He was referred to Dr. Arbie Cookey for reasons that are not totally clear.  He did not feel that there was significant arterial or venous explanation for the patient's symptoms.    I also note that he had bleeding into his right ear.  He was referred to ENT and came back with an order for Ciprodex otic to the right ear.  This was on 12/11/2014.    PAST MEDICAL HISTORY/PROBLEM LIST:          Uncontrolled hypertension.    Atrial fibrillation.    Dehydration.    Vertigo.    BPH.    Tinnitus.    Anxiety.    Hypothyroidism.     Gastroesophageal reflux.    Glaucoma.    SOCIAL HISTORY:       HOUSING:  The patient lived in a group home in [place] before coming back here.  Although it was initially unclear to me when he came into the building first why he actually needed to live in a nursing home, apparently his medical issues were so unstable at the time.    RESPONSIBLE PARTY:  He has a legal guardian.    CURRENT MEDICATIONS:  Medication list is reviewed.                Xarelto 20 mg daily.     Lipitor 40 q.d.      Prilosec 20 q.d.      Amiodarone 200 daily.    Fish oil 2 capsules/2 g daily.      Pataday ophthalmic.    Allopurinol 100 mg daily.     Lisinopril 2.5 daily.      Synthroid 200 daily.    Lasix 20 q.d.     Lopressor 50 b.i.d.      Senokot-S 8.6 b.i.d.      VESIcare 5 mg daily.     Vitamin D3, 50,000 U monthly.    REVIEW OF SYSTEMS:    GENERAL:  The patient has no specific complaints.   HEENT:  He is not having ear pain or bleeding from the ear.   CHEST/RESPIRATORY:  No cough.  No sputum.   CARDIAC:  No chest pain.    GI:  No nausea or vomiting.   GU he does describe urinary frequency. MUSCULOSKELETAL:  He is still complaining of pain in his left foot or ankle. He does not dexribe any other pain.    PHYSICAL  EXAMINATION:   VITAL SIGNS:     PULSE:  68.    RESPIRATIONS:  20.   BLOOD PRESSURE:  142/80.     WEIGHT:  Most recent weight on 11/30/2014 was 241 pounds.    GENERAL APPEARANCE:  The patient is not in any distress.    CHEST/RESPIRATORY:  Clear air entry bilaterally.    CARDIOVASCULAR:   CARDIAC:  Heart sounds are normal and sound regular.  There are no murmurs.   No gallops.  He appears to be euvolemic.   GASTROINTESTINAL:   ABDOMEN:  Bowel sounds are positive.   LIVER/SPLEEN/KIDNEYS:  No liver, no spleen.   MUSCULOSKELETAL:   EXTREMITIES:   BILATERAL LOWER EXTREMITIES:  He still has a lot of pain on the plantar aspect of his metatarsal heads, especially the first.  He has no pain in the left ankle.  The left ankle itself seems slightly more enlarged than on the right, but there is no active pain here.    VASCULAR:   ARTERIAL:  Peripheral pulses are palpable in the posterotibials bilaterally.    ASSESSMENT/PLAN:                Atrial fibrillation.  The heart rate is controlled.  He is on anticoagulation.  He is still on Xarelto, but this was held for two days when he had bleeding from his ear.    History of poorly controlled high blood pressure.  This is actually a lot more stable, generally in the 130s to 140 range. Apparently before he cam here he had episodes of high blood pressure. This has not been an issue  here  Hypothyroidism.  On replacement.  Last TSH I see on 10/11/2014 was 5.660.    Left foot pain.  Although he does have gout and I see that a uric acid level has been ordered, I wonder whether he has metatarsalgia.  While there is a lot of pain across his metatarsal heads, there is no evidence of inflammation that usually characterizes gouty pain.    Ear problems.  I am assuming that this was felt to be otitis externa, although I do not actually see a note from the ENT doctor.

## 2015-02-18 ENCOUNTER — Non-Acute Institutional Stay (SKILLED_NURSING_FACILITY): Payer: Medicare Other | Admitting: Internal Medicine

## 2015-02-18 DIAGNOSIS — N183 Chronic kidney disease, stage 3 unspecified: Secondary | ICD-10-CM

## 2015-02-18 DIAGNOSIS — E038 Other specified hypothyroidism: Secondary | ICD-10-CM

## 2015-02-18 DIAGNOSIS — I481 Persistent atrial fibrillation: Secondary | ICD-10-CM | POA: Diagnosis not present

## 2015-02-18 DIAGNOSIS — I4819 Other persistent atrial fibrillation: Secondary | ICD-10-CM

## 2015-02-18 DIAGNOSIS — M10072 Idiopathic gout, left ankle and foot: Secondary | ICD-10-CM

## 2015-02-18 DIAGNOSIS — M109 Gout, unspecified: Secondary | ICD-10-CM

## 2015-02-18 NOTE — Progress Notes (Signed)
Patient ID: Sean Hardy, male   DOB: 1950-12-11, 64 y.o.   MRN: [id number]                 PROGRESS NOTE  DATE:  02/18/15      [facility]                LEVEL OF CARE:   SNF   Routine Visit                CHIEF COMPLAINT:  Review of medical issues/[facility] visit/review of [facility] records      HISTORY OF PRESENT ILLNESS:  This is a patient who came to [place] initially from [facility] with recurrent atrial fibrillation.  He is on Xarelto.  He has a prior history of a CVA.    Recently, he complained of pain in his left foot and ankle.  There was some suggestion that he had a history of gout with an elevated uric acid level of 9.9, most recent uric acid level is 6.8 and he says he feels better. He is now on allopurinol 200 mg a day  Apparently, he complained to the [facility] nurse practitioner about this.  He was referred to Dr. Arbie Cookey for reasons that are not totally clear.  He did not feel that there was significant arterial or venous explanation for the patient's symptoms.    I also note that he had bleeding into his right ear.  He was referred to ENT and came back with an order for Ciprodex otic to the right ear.  This was on 12/11/2014.    PAST MEDICAL HISTORY/PROBLEM LIST:          Uncontrolled hypertension.    Atrial fibrillation.    Dehydration.    Vertigo.    BPH.    Tinnitus.    Anxiety.    Hypothyroidism.     Gastroesophageal reflux.    Glaucoma.    Gout  SOCIAL HISTORY:       HOUSING:  The patient lived in a group home in [place] before coming back here.  Although it was initially unclear to me when he came into the building first why he actually needed to live in a nursing home, apparently his medical issues were so unstable at the time.    RESPONSIBLE PARTY:  He has a legal guardian.    CURRENT MEDICATIONS:  Medication list is reviewed.                Xarelto 20 mg daily.     Lipitor 40 q.d.      Prilosec 20 q.d.      Amiodarone 200 daily.      Fish oil 2 capsules/2 g daily.      Pataday ophthalmic.    Allopurinol 200 mg daily.     Lisinopril 2.5 daily.     Synthroid 200 daily.    Lasix 20 q.d.     Lopressor 50 b.i.d.      Senokot-S 8.6 b.i.d.      VESIcare 5 mg daily.     Vitamin D3, 50,000 U monthly.    Labs; on 8/26 sodium at 141 potassium 4.1 chloride 99 CO2 23 BUN 17 creatinine 1.39 phosphorus 3.8 the PTH slightly elevated at 85  REVIEW OF SYSTEMS:    GENERAL:  The patient has no specific complaints.   HEENT:  He is not having ear pain or bleeding from the ear.   CHEST/RESPIRATORY:  No cough.  No sputum.  CARDIAC:  No chest pain.    GI:  No nausea or vomiting.   GU he does describe urinary frequency. MUSCULOSKELETAL:  He states his left foot pain is a lot better  PHYSICAL EXAMINATION:   VITAL SIGNS:     PULSE:  78    RESPIRATIONS:  20.   BLOOD PRESSURE:  146/84  WEIGHT:  Most recent weight was 238 pounds GENERAL APPEARANCE:  The patient is not in any distress.    CHEST/RESPIRATORY:  Clear air entry bilaterally.    CARDIOVASCULAR:   CARDIAC:  Heart sounds are normal and sound regular.  There are no murmurs.   No gallops.  He appears to be euvolemic.   GASTROINTESTINAL:   ABDOMEN:  Bowel sounds are positive.   LIVER/SPLEEN/KIDNEYS:  No liver, no spleen.   MUSCULOSKELETAL:   EXTREMITIES:   BILATERAL LOWER EXTREMITIES:  He still has a lot of pain on the plantar aspect of his metatarsal heads, especially the first.  He has no pain in the left ankle.  The left ankle itself seems slightly more enlarged than on the right, but there is no active pain here.    VASCULAR:   ARTERIAL:  Peripheral pulses are palpable in the posterotibials bilaterally.    ASSESSMENT/PLAN:                Atrial fibrillation.  The heart rate is controlled.  He is on anticoagulation.  He is still on Xarelto, but this was held for two days when he had bleeding from his ear.  He is also on amiodarone  History of poorly controlled  high blood pressure.  This is actually a lot more stable, generally in the 130s to 140 range. Apparently before he cam here he had episodes of high blood pressure. This has not been an issue here  Chronic renal failure; he had an ultrasound of his kidneys that showed a benign cyst in the left upper pole otherwise unremarkable exam. Probably secondary to hypertension. Estimated GFR is 55  Hypothyroidism.  On replacement.  Last TSH I see was 0.774 on 01/05/15   Left foot pain.  This was likely secondary to acute gout uric acid level is now down to 6.8 from 9.9 earlier this year his symptoms appear to be better as well.   Hyperlipidemia on Lipitor

## 2015-06-18 ENCOUNTER — Ambulatory Visit: Payer: Medicare Other | Admitting: "Endocrinology

## 2015-10-10 ENCOUNTER — Institutional Professional Consult (permissible substitution) (INDEPENDENT_AMBULATORY_CARE_PROVIDER_SITE_OTHER): Payer: Medicare Other | Admitting: Surgery

## 2015-10-10 VITALS — BP 183/100 | HR 60 | Resp 20 | Ht 74.0 in | Wt 252.0 lb

## 2015-10-10 DIAGNOSIS — I7121 Aneurysm of the ascending aorta, without rupture: Secondary | ICD-10-CM

## 2015-10-10 DIAGNOSIS — I719 Aortic aneurysm of unspecified site, without rupture: Secondary | ICD-10-CM

## 2015-10-11 ENCOUNTER — Ambulatory Visit: Payer: Medicare Other | Admitting: Cardiology

## 2015-10-11 ENCOUNTER — Encounter: Payer: Self-pay | Admitting: *Deleted

## 2015-10-11 NOTE — Progress Notes (Unsigned)
Patient ID: Sean Hardy, male   DOB: 02/17/1951, 65 y.o.   MRN: [id number]     Clinical Summary Sean Hardy is a 64 y.o.male last seen by Dr Dietrich Patesothbart, this is our first visit together. He is seen for the following medical problems. He was most recently seen by Dr Molly Madurolevenger at [facility] a few months ago.   1. CAD - fairly unclear history from available records, appears he had coroanry stents done at [facility] several years ago. It looks like he had a PCI to his LAD in 2003.   2. Aflutter - had been on amiodarone - from notes history of medication noncompliance, was not started on anticoag.  - [facility] notes briefly mention a prior DCCV - he had been on amio and anticoag in the past, according to [facility] note 04/2013 both stopped due to lack of recurrence.   3. HTN   4. History of cardiomyopathy - 10/25/2010 echo LVEF 15% - 11/15/10 LVEF 55-60% - Echocardiogram December 2014 and ejection fraction 55-60%, dilated aortic root (CT scan below), moderate lae,1+ MR, G1DD     5. NSVT  6. History of CVA    SH: court makes decisions.    Past Medical History  Diagnosis Date  . Arteriosclerotic cardiovascular disease (ASCVD) 2006    Stents to unknown arteries by cardiologist [facility] ; echo in 2006-normal EF, moderate LVH, mild inferior hypokinesis  . Hypertension   . 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 Hypothyroidism   . Atrial flutter 2012    Left atrial appendage thrombus with SEC; converted on amiodarone to marked SB  . Cardiomyopathy 2012    CHF in 10/2010; possibly tachycardia mediated; EF of 20-25%  . Chronic anticoagulation 2012    Discontinued  . Tobacco abuse     40 pack years; Tapering use; 10/2012:1/5 pack per day  . Gout   . Benign prostatic hypertrophy   . Nephrolithiasis     Lithotripsy; left ureteral calculus in 10/2012  . Stroke   . Gout   . Anxiety   . Major depressive disorder   . CHF (congestive heart failure)     . Hyperlipidemia   . Peripheral vascular disease   . Atrial fibrillation   . GERD (gastroesophageal reflux disease)   . Muscle weakness      Allergies  Allergen Reactions  . Penicillins      Current Outpatient Prescriptions  Medication Sig Dispense Refill  . allopurinol (ZYLOPRIM) 100 MG tablet Take 100 mg by mouth daily.    Marland Kitchen. amiodarone (PACERONE) 200 MG tablet TAKE ONE TABLET BY MOUTH EVERY DAY 30 tablet 6  . amLODipine (NORVASC) 5 MG tablet Take 5 mg by mouth daily.    Marland Kitchen. aspirin 81 MG tablet Take 81 mg by mouth daily.      . Aspirin-Acetaminophen-Caffeine (EXCEDRIN PO) Take by mouth.    Marland Kitchen. atorvastatin (LIPITOR) 40 MG tablet Take 40 mg by mouth daily.      . Bismuth Subsalicylate (PEPTO-BISMOL PO) Take by mouth.    . Cholecalciferol 5000 UNITS TABS Take 1 tablet by mouth every 30 (thirty) days.    . Dextromethorphan-Guaifenesin (TUSSIN DM PO) Take by mouth.    . DM-Doxylamine-Acetaminophen (NITE TIME COLD/FLU RELIEF PO) Take by mouth.    . furosemide (LASIX) 20 MG tablet Take 20 mg by mouth daily.    Marland Kitchen. LEVOTHYROXINE SODIUM PO Take 225 mcg by mouth daily.    Marland Kitchen. lisinopril (  PRINIVIL,ZESTRIL) 20 MG tablet Take 1 tablet (20 mg total) by mouth daily. Increase in dose 30 tablet 3  . magnesium hydroxide (MILK OF MAGNESIA) 400 MG/5ML suspension Take 30 mLs by mouth daily as needed for mild constipation.    . metoprolol succinate (TOPROL-XL) 25 MG 24 hr tablet Take 50 mg by mouth daily.    . nitroGLYCERIN (NITROSTAT) 0.4 MG SL tablet Place 0.4 mg under the tongue every 5 (five) minutes as needed.      . Olopatadine HCl 0.2 % SOLN Apply 1 drop to eye.    . Omega-3 Fatty Acids (SUPER OMEGA-EPA) 1000 MG CAPS Take 2 capsules by mouth daily.    Marland Kitchen omeprazole (PRILOSEC) 20 MG capsule Take 20 mg by mouth daily.    . rivaroxaban (XARELTO) 20 MG TABS tablet Take 20 mg by mouth daily with supper.    . senna (SENOKOT) 8.6 MG tablet Take 1 tablet by mouth 2 (two) times daily.    . solifenacin  (VESICARE) 5 MG tablet Take 10 mg by mouth daily.      . tamsulosin (FLOMAX) 0.4 MG CAPS Take by mouth daily.     No current facility-administered medications for this visit.     Past Surgical History  Procedure Laterality Date  . Lithotripsy    . Cardioversion    . Coronary stent placement  2003     Allergies  Allergen Reactions  . Penicillins       Family History  Problem Relation Age of Onset  . Dementia Mother   . Dementia Father      Social History Sean Hardy reports that he quit smoking about 2 years ago. His smoking use included Cigarettes. He has a 20 pack-year smoking history. He has never used smokeless tobacco. Sean Hardy reports that he does not drink alcohol.   Review of Systems CONSTITUTIONAL: No weight loss, fever, chills, weakness or fatigue.  HEENT: Eyes: No visual loss, blurred vision, double vision or yellow sclerae.No hearing loss, sneezing, congestion, runny nose or sore throat.  SKIN: No rash or itching.  CARDIOVASCULAR:  RESPIRATORY: No shortness of breath, cough or sputum.  GASTROINTESTINAL: No anorexia, nausea, vomiting or diarrhea. No abdominal pain or blood.  GENITOURINARY: No burning on urination, no polyuria NEUROLOGICAL: No headache, dizziness, syncope, paralysis, ataxia, numbness or tingling in the extremities. No change in bowel or bladder control.  MUSCULOSKELETAL: No muscle, back pain, joint pain or stiffness.  LYMPHATICS: No enlarged nodes. No history of splenectomy.  PSYCHIATRIC: No history of depression or anxiety.  ENDOCRINOLOGIC: No reports of sweating, cold or heat intolerance. No polyuria or polydipsia.  Marland Kitchen   Physical Examination There were no vitals filed for this visit. There were no vitals filed for this visit.  Gen: resting comfortably, no acute distress HEENT: no scleral icterus, pupils equal round and reactive, no palptable cervical adenopathy,  CV Resp: Clear to auscultation bilaterally GI: abdomen is soft,  non-tender, non-distended, normal bowel sounds, no hepatosplenomegaly MSK: extremities are warm, no edema.  Skin: warm, no rash Neuro:  no focal deficits Psych: appropriate affect   Diagnostic Studies     Assessment and Plan        Antoine Poche, M.D., F.A.C.C.

## 2015-10-14 ENCOUNTER — Encounter: Payer: Self-pay | Admitting: Surgery

## 2015-10-14 DIAGNOSIS — I7121 Aneurysm of the ascending aorta, without rupture: Secondary | ICD-10-CM | POA: Insufficient documentation

## 2015-10-14 DIAGNOSIS — I719 Aortic aneurysm of unspecified site, without rupture: Secondary | ICD-10-CM | POA: Insufficient documentation

## 2015-10-14 NOTE — Progress Notes (Signed)
Cardiothoracic Surgery Consultation  PCP is Kirstie Peri, MD Referring Provider is Clevenger, Lacretia Leigh, MD  Chief Complaint  Patient presents with  . Thoracic Aortic Aneurysm    Surgical eval,CTA Chest  08/30/15@[facility]     HPI:  The patient is a 65 year old gentleman with a history of hypertension, CAD s/p PCI at [facility] in the past, right thalamic stroke with left hemiparesis, atrial fibrillation/flutter, cardiomyopathy that is possibly tachycardia mediated with an EF of 20-25% in the past, previous heavy smoking,  hypothyroidism, gout, and a long history of decreased mental functioning who is currently a ward of [facility] and lives in [facility]. He has a state appointed guardian who is with him today. He was seen by Dr. Abelardo Diesel of [facility] on 08/21/2015 but I am not sure if that was his first encounter. He had a history of a thoracic aneurysm so a CTA of the chest was done at [facility] on 08/30/2015. 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 The ascending aorta was 4.3 cm and was unchanged. The transverse arch was 3.2 cm and unchanged and the descending aorta was 3.3 cm and unchanged. There were coronary artery calcifications.   Past Medical History  Diagnosis Date  . Arteriosclerotic cardiovascular disease (ASCVD) 2006    Stents to unknown arteries by [facility]ologist at [facility] ; echo in 2006-normal EF, moderate LVH, mild inferior hypokinesis  . Hypertension   . 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 Hypothyroidism   . Atrial flutter (HCC) 2012    Left atrial appendage thrombus with SEC; converted on amiodarone to marked SB  . Cardiomyopathy 2012    CHF in 10/2010; possibly tachycardia mediated; EF of 20-25%  . Chronic anticoagulation 2012    Discontinued  . Tobacco abuse     40 pack years; Tapering use; 10/2012:1/5  pack per day  . Gout   . Benign prostatic hypertrophy   . Nephrolithiasis     Lithotripsy; left ureteral calculus in 10/2012  . Stroke (HCC)   . Gout   . Anxiety   . Major depressive disorder (HCC)   . CHF (congestive heart failure) (HCC)   . Hyperlipidemia   . Peripheral vascular disease (HCC)   . Atrial fibrillation (HCC)   . GERD (gastroesophageal reflux disease)   . Muscle weakness     Past Surgical History  Procedure Laterality Date  . Lithotripsy    . Cardioversion    . Coronary stent placement  2003    Family History  Problem Relation Age of Onset  . Dementia Mother   . Dementia Father     Social History Social History  Substance Use Topics  . Smoking status: Former Smoker -- 0.50 packs/day for 40 years    Types: Cigarettes    Quit date: 12/04/2012  . Smokeless tobacco: Never Used     Comment: 10/2012:1/5 pack per day  . Alcohol Use: No    Current Outpatient Prescriptions  Medication Sig Dispense Refill  . allopurinol (ZYLOPRIM) 100 MG tablet Take 100 mg by mouth daily.    Marland Kitchen amiodarone (PACERONE) 200 MG tablet TAKE ONE TABLET BY MOUTH EVERY DAY 30 tablet 6  . amLODipine (NORVASC) 5 MG tablet Take 5 mg by mouth daily.    Marland Kitchen aspirin 81 MG tablet Take 81 mg by mouth daily.      Marland Kitchen  Aspirin-Acetaminophen-Caffeine (EXCEDRIN PO) Take by mouth.    Marland Kitchen. atorvastatin (LIPITOR) 40 MG tablet Take 40 mg by mouth daily.      . Bismuth Subsalicylate (PEPTO-BISMOL PO) Take by mouth.    . Cholecalciferol 5000 UNITS TABS Take 1 tablet by mouth every 30 (thirty) days.    . Dextromethorphan-Guaifenesin (TUSSIN DM PO) Take by mouth.    . DM-Doxylamine-Acetaminophen (NITE TIME COLD/FLU RELIEF PO) Take by mouth.    . furosemide (LASIX) 20 MG tablet Take 20 mg by mouth daily.    Marland Kitchen. LEVOTHYROXINE SODIUM PO Take 225 mcg by mouth daily.    Marland Kitchen. lisinopril (PRINIVIL,ZESTRIL) 20 MG tablet Take 1 tablet (20 mg total) by mouth daily. Increase in dose 30 tablet 3  . magnesium hydroxide (MILK OF  MAGNESIA) 400 MG/5ML suspension Take 30 mLs by mouth daily as needed for mild constipation.    . metoprolol succinate (TOPROL-XL) 25 MG 24 hr tablet Take 50 mg by mouth daily.    . nitroGLYCERIN (NITROSTAT) 0.4 MG SL tablet Place 0.4 mg under the tongue every 5 (five) minutes as needed.      . Olopatadine HCl 0.2 % SOLN Apply 1 drop to eye.    . Omega-3 Fatty Acids (SUPER OMEGA-EPA) 1000 MG CAPS Take 2 capsules by mouth daily.    Marland Kitchen. omeprazole (PRILOSEC) 20 MG capsule Take 20 mg by mouth daily.    . rivaroxaban (XARELTO) 20 MG TABS tablet Take 20 mg by mouth daily with supper.    . senna (SENOKOT) 8.6 MG tablet Take 1 tablet by mouth 2 (two) times daily.    . solifenacin (VESICARE) 5 MG tablet Take 10 mg by mouth daily.      . tamsulosin (FLOMAX) 0.4 MG CAPS Take by mouth daily.     No current facility-administered medications for this visit.    Allergies  Allergen Reactions  . Penicillins     Review of Systems  Constitutional: Negative for activity change, appetite change and fatigue.       Has gained weight. Non-compliant with diet and frequently eats fast food.  HENT: Negative.   Eyes: Negative.   Respiratory: Positive for shortness of breath.   Cardiovascular: Positive for palpitations and leg swelling. Negative for chest pain.       Orthopnea  Endocrine: Negative.   Genitourinary: Negative.   Allergic/Immunologic: Negative.   Neurological: Negative.   Hematological: Negative.   Psychiatric/Behavioral:       Chronic decreased mental capacity and inability to care for himself    BP 183/100 mmHg  Pulse 60  Resp 20  Ht 6\' 2"  (1.88 m)  Wt 252 lb (114.306 kg)  BMI 32.34 kg/m2  SpO2 96% Physical Exam  Constitutional: He is oriented to person, place, and time.  Obese gentleman in no distress  HENT:  Head: Normocephalic and atraumatic.  Mouth/Throat: Oropharynx is clear and moist.  Eyes: EOM are normal. Pupils are equal, round, and reactive to light.  Neck: Normal range  of motion. Neck supple. No JVD present. No thyromegaly present.  Cardiovascular: Normal rate, regular rhythm and normal heart sounds.  Exam reveals no gallop and no friction rub.   No murmur heard. Pulmonary/Chest: Effort normal and breath sounds normal. No respiratory distress. He has no rales.  Abdominal: Soft. Bowel sounds are normal. He exhibits no distension and no mass. There is no tenderness.  Musculoskeletal: Normal range of motion. He exhibits edema.  Lymphadenopathy:    He has no cervical adenopathy.  Neurological:  He is alert and oriented to person, place, and time. No cranial nerve deficit.  Left arm and leg slightly weaker than right.   Skin: Skin is warm and dry.  Psychiatric: He has a normal mood and affect.  Answers questions slowly with simple answers    Diagnostic Tests:  CTA of the chest from 08/30/2015 at [facility] reviewed on PACS.  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 He has a significant cardiac history with prior stent, atrial fib/flutter, cardiomyopathy. He had had a stroke in the past but I don't know if that was related to atrial fibrillation. His prior care was mostly in the [facility] system. I think he does require aortic root replacement to prevent progressive enlargement, aortic dissection or rupture. His cardiologist is moving and his guardian would like to establish care with the [facility] cardiology practice in [place]. He will need an echo and cath after evaluation for further workup and tune-up prior to any surgery.   Plan:  We will set up an appt with the cardiology group in [place] and I will plan to see him back after that evaluation and echo/cath are done to discuss surgical treatment further.  Alleen Borne, MD [facility] [phone]

## 2015-10-15 ENCOUNTER — Observation Stay (HOSPITAL_COMMUNITY)
Admission: EM | Admit: 2015-10-15 | Discharge: 2015-10-16 | Disposition: A | Discharge status: Home / Self Care | Payer: Medicare Other | Attending: Internal Medicine | Admitting: Internal Medicine

## 2015-10-15 ENCOUNTER — Emergency Department (HOSPITAL_COMMUNITY): Payer: Medicare Other

## 2015-10-15 ENCOUNTER — Encounter (HOSPITAL_COMMUNITY): Payer: Self-pay | Admitting: *Deleted

## 2015-10-15 DIAGNOSIS — I509 Heart failure, unspecified: Secondary | ICD-10-CM | POA: Insufficient documentation

## 2015-10-15 DIAGNOSIS — R079 Chest pain, unspecified: Principal | ICD-10-CM | POA: Diagnosis present

## 2015-10-15 DIAGNOSIS — I4892 Unspecified atrial flutter: Secondary | ICD-10-CM | POA: Diagnosis present

## 2015-10-15 DIAGNOSIS — I712 Thoracic aortic aneurysm, without rupture, unspecified: Secondary | ICD-10-CM

## 2015-10-15 DIAGNOSIS — I4891 Unspecified atrial fibrillation: Secondary | ICD-10-CM | POA: Insufficient documentation

## 2015-10-15 DIAGNOSIS — I739 Peripheral vascular disease, unspecified: Secondary | ICD-10-CM | POA: Diagnosis not present

## 2015-10-15 DIAGNOSIS — N183 Chronic kidney disease, stage 3 unspecified: Secondary | ICD-10-CM | POA: Diagnosis present

## 2015-10-15 DIAGNOSIS — I719 Aortic aneurysm of unspecified site, without rupture: Secondary | ICD-10-CM | POA: Diagnosis not present

## 2015-10-15 DIAGNOSIS — I10 Essential (primary) hypertension: Secondary | ICD-10-CM | POA: Diagnosis present

## 2015-10-15 DIAGNOSIS — I7121 Aneurysm of the ascending aorta, without rupture: Secondary | ICD-10-CM | POA: Diagnosis present

## 2015-10-15 DIAGNOSIS — I2510 Atherosclerotic heart disease of native coronary artery without angina pectoris: Secondary | ICD-10-CM | POA: Insufficient documentation

## 2015-10-15 DIAGNOSIS — I110 Hypertensive heart disease with heart failure: Secondary | ICD-10-CM | POA: Diagnosis not present

## 2015-10-15 DIAGNOSIS — E785 Hyperlipidemia, unspecified: Secondary | ICD-10-CM | POA: Insufficient documentation

## 2015-10-15 DIAGNOSIS — E039 Hypothyroidism, unspecified: Secondary | ICD-10-CM | POA: Diagnosis not present

## 2015-10-15 DIAGNOSIS — Z79899 Other long term (current) drug therapy: Secondary | ICD-10-CM | POA: Diagnosis not present

## 2015-10-15 DIAGNOSIS — Q2543 Congenital aneurysm of aorta: Secondary | ICD-10-CM | POA: Diagnosis present

## 2015-10-15 DIAGNOSIS — Z8673 Personal history of transient ischemic attack (TIA), and cerebral infarction without residual deficits: Secondary | ICD-10-CM

## 2015-10-15 DIAGNOSIS — F329 Major depressive disorder, single episode, unspecified: Secondary | ICD-10-CM | POA: Diagnosis not present

## 2015-10-15 DIAGNOSIS — Z87891 Personal history of nicotine dependence: Secondary | ICD-10-CM | POA: Diagnosis not present

## 2015-10-15 DIAGNOSIS — R072 Precordial pain: Secondary | ICD-10-CM | POA: Diagnosis not present

## 2015-10-15 DIAGNOSIS — G8194 Hemiplegia, unspecified affecting left nondominant side: Secondary | ICD-10-CM

## 2015-10-15 HISTORY — DX: Thoracic aortic aneurysm, without rupture: I71.2

## 2015-10-15 HISTORY — DX: Thoracic aortic aneurysm, without rupture, unspecified: I71.20

## 2015-10-15 LAB — BASIC METABOLIC PANEL
Anion gap: 7 (ref 5–15)
BUN: 16 mg/dL (ref 6–20)
CALCIUM: 9.1 mg/dL (ref 8.9–10.3)
CO2: 28 mmol/L (ref 22–32)
CREATININE: 1.3 mg/dL — AB (ref 0.61–1.24)
Chloride: 103 mmol/L (ref 101–111)
GFR calc non Af Amer: 56 mL/min — ABNORMAL LOW (ref >60)
Glucose, Bld: 109 mg/dL — ABNORMAL HIGH (ref 65–99)
Potassium: 4.1 mmol/L (ref 3.5–5.1)
Sodium: 138 mmol/L (ref 135–145)

## 2015-10-15 LAB — CBC
HCT: 40.5 % (ref 39.0–52.0)
Hemoglobin: 13.7 g/dL (ref 13.0–17.0)
MCH: 32.9 pg (ref 26.0–34.0)
MCHC: 33.8 g/dL (ref 30.0–36.0)
MCV: 97.4 fL (ref 78.0–100.0)
PLATELETS: 200 10*3/uL (ref 150–400)
RBC: 4.16 MIL/uL — AB (ref 4.22–5.81)
RDW: 13.5 % (ref 11.5–15.5)
WBC: 6.2 10*3/uL (ref 4.0–10.5)

## 2015-10-15 LAB — I-STAT CHEM 8, ED
BUN: 15 mg/dL (ref 6–20)
CALCIUM ION: 1.18 mmol/L (ref 1.13–1.30)
Chloride: 100 mmol/L — ABNORMAL LOW (ref 101–111)
Creatinine, Ser: 1.4 mg/dL — ABNORMAL HIGH (ref 0.61–1.24)
GLUCOSE: 103 mg/dL — AB (ref 65–99)
HCT: 42 % (ref 39.0–52.0)
HEMOGLOBIN: 14.3 g/dL (ref 13.0–17.0)
Potassium: 4.1 mmol/L (ref 3.5–5.1)
Sodium: 141 mmol/L (ref 135–145)
TCO2: 28 mmol/L (ref 0–100)

## 2015-10-15 LAB — DIGOXIN LEVEL: Digoxin Level: <0.2 ng/mL — ABNORMAL LOW (ref 0.8–2.0)

## 2015-10-15 LAB — TROPONIN I: Troponin I: <0.03 ng/mL (ref <0.031)

## 2015-10-15 LAB — PROTIME-INR
INR: 3.09 — ABNORMAL HIGH (ref 0.00–1.49)
PROTHROMBIN TIME: 31.3 s — AB (ref 11.6–15.2)

## 2015-10-15 LAB — MRSA PCR SCREENING: MRSA by PCR: NEGATIVE

## 2015-10-15 MED ORDER — FUROSEMIDE 10 MG/ML IJ SOLN
40.0000 mg | Freq: Two times a day (BID) | INTRAMUSCULAR | Status: DC
  Administered 2015-10-15 – 2015-10-16 (×2): 40 mg via INTRAVENOUS
  Filled 2015-10-15 (×2): qty 4

## 2015-10-15 MED ORDER — POLYETHYLENE GLYCOL 3350 17 G PO PACK
17.0000 g | PACK | Freq: Every day | ORAL | Status: DC
  Administered 2015-10-16: 17 g via ORAL
  Filled 2015-10-15: qty 1

## 2015-10-15 MED ORDER — NITROGLYCERIN 0.4 MG SL SUBL: 0.4000 mg | SUBLINGUAL_TABLET | SUBLINGUAL | Status: DC | PRN

## 2015-10-15 MED ORDER — LISINOPRIL 5 MG PO TABS
5.0000 mg | ORAL_TABLET | Freq: Every day | ORAL | Status: DC
  Administered 2015-10-16: 5 mg via ORAL
  Filled 2015-10-15: qty 1

## 2015-10-15 MED ORDER — ATORVASTATIN CALCIUM 40 MG PO TABS
40.0000 mg | ORAL_TABLET | Freq: Every day | ORAL | Status: DC
  Administered 2015-10-15: 40 mg via ORAL
  Filled 2015-10-15: qty 1

## 2015-10-15 MED ORDER — DARIFENACIN HYDROBROMIDE ER 7.5 MG PO TB24
7.5000 mg | ORAL_TABLET | Freq: Every day | ORAL | Status: DC
  Administered 2015-10-15 – 2015-10-16 (×2): 7.5 mg via ORAL
  Filled 2015-10-15 (×2): qty 1

## 2015-10-15 MED ORDER — PNEUMOCOCCAL VAC POLYVALENT 25 MCG/0.5ML IJ INJ: 0.5000 mL | INJECTION | INTRAMUSCULAR | Status: DC

## 2015-10-15 MED ORDER — ACETAMINOPHEN 325 MG PO TABS: 650.0000 mg | ORAL_TABLET | ORAL | Status: DC | PRN

## 2015-10-15 MED ORDER — METOPROLOL TARTRATE 50 MG PO TABS
50.0000 mg | ORAL_TABLET | Freq: Two times a day (BID) | ORAL | Status: DC
  Administered 2015-10-15 – 2015-10-16 (×2): 50 mg via ORAL
  Filled 2015-10-15 (×2): qty 1

## 2015-10-15 MED ORDER — ONDANSETRON HCL 4 MG/2ML IJ SOLN: 4.0000 mg | Freq: Four times a day (QID) | INTRAMUSCULAR | Status: DC | PRN

## 2015-10-15 MED ORDER — TRAMADOL HCL 50 MG PO TABS
50.0000 mg | ORAL_TABLET | Freq: Two times a day (BID) | ORAL | Status: DC | PRN
  Administered 2015-10-15: 50 mg via ORAL
  Filled 2015-10-15: qty 1

## 2015-10-15 MED ORDER — TAMSULOSIN HCL 0.4 MG PO CAPS
0.4000 mg | ORAL_CAPSULE | Freq: Every day | ORAL | Status: DC
  Administered 2015-10-16: 0.4 mg via ORAL
  Filled 2015-10-15: qty 1

## 2015-10-15 MED ORDER — SODIUM CHLORIDE 0.9% FLUSH: 3.0000 mL | INTRAVENOUS | Status: DC | PRN

## 2015-10-15 MED ORDER — RIVAROXABAN 20 MG PO TABS: 20.0000 mg | ORAL_TABLET | Freq: Every day | ORAL | Status: DC

## 2015-10-15 MED ORDER — SENNA 8.6 MG PO TABS
1.0000 | ORAL_TABLET | Freq: Two times a day (BID) | ORAL | Status: DC
  Administered 2015-10-15 – 2015-10-16 (×2): 8.6 mg via ORAL
  Filled 2015-10-15 (×2): qty 1

## 2015-10-15 MED ORDER — IOPAMIDOL (ISOVUE-370) INJECTION 76%
100.0000 mL | Freq: Once | INTRAVENOUS | Status: AC | PRN
Start: 2015-10-15 — End: 2015-10-15
  Administered 2015-10-15: 100 mL via INTRAVENOUS

## 2015-10-15 MED ORDER — SODIUM CHLORIDE 0.9% FLUSH
3.0000 mL | Freq: Two times a day (BID) | INTRAVENOUS | Status: DC
  Administered 2015-10-15 – 2015-10-16 (×2): 3 mL via INTRAVENOUS

## 2015-10-15 MED ORDER — SODIUM CHLORIDE 0.9 % IV SOLN: 250.0000 mL | INTRAVENOUS | Status: DC | PRN

## 2015-10-15 MED ORDER — HYDRALAZINE HCL 20 MG/ML IJ SOLN
5.0000 mg | Freq: Once | INTRAMUSCULAR | Status: AC
  Administered 2015-10-15: 5 mg via INTRAVENOUS
  Filled 2015-10-15: qty 1

## 2015-10-15 MED ORDER — ALLOPURINOL 100 MG PO TABS
100.0000 mg | ORAL_TABLET | Freq: Every day | ORAL | Status: DC
  Administered 2015-10-16: 100 mg via ORAL
  Filled 2015-10-15: qty 1

## 2015-10-15 MED ORDER — LEVOTHYROXINE SODIUM 75 MCG PO TABS
175.0000 ug | ORAL_TABLET | Freq: Every day | ORAL | Status: DC
  Administered 2015-10-16: 175 ug via ORAL
  Filled 2015-10-15: qty 1

## 2015-10-15 MED ORDER — PANTOPRAZOLE SODIUM 40 MG PO TBEC
40.0000 mg | DELAYED_RELEASE_TABLET | Freq: Every day | ORAL | Status: DC
  Administered 2015-10-15 – 2015-10-16 (×2): 40 mg via ORAL
  Filled 2015-10-15 (×2): qty 1

## 2015-10-15 MED ORDER — AMIODARONE HCL 200 MG PO TABS
200.0000 mg | ORAL_TABLET | Freq: Every day | ORAL | Status: DC
  Administered 2015-10-16: 200 mg via ORAL
  Filled 2015-10-15: qty 1

## 2015-10-15 NOTE — ED Notes (Signed)
Lab at bedside
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
## 2015-10-15 NOTE — ED Notes (Addendum)
Pt started having chest pain last night. He told [facility] this morning. They gave him 1 nitro which caused his pain to go away. His pain then came back and EMS was called. Per facility, pt has hx of Thoracic aortic aneurysm and is planning to get that repaired.    Pt has had 1 Nitro today 0900 today & 2 Nitro with EMS between 1130-1200  324 ASA

## 2015-10-15 NOTE — ED Provider Notes (Signed)
CSN: [id number]     Arrival date & time 10/15/15  1208 History  By signing my name below, I, Tanda Rockers, attest that this documentation has been prepared under the direction and in the presence of Glynn Octave, MD. Electronically Signed: Tanda Rockers, ED Scribe. 10/15/2015. 12:48 PM.   Chief Complaint  Patient presents with  . Chest Pain   The history is provided by the patient. History limited by: Pt is poor historian. No language interpreter was used.    HPI Comments: Sean Hardy is a 65 y.o. male brought in by ambulance, with PMHx CAD s/p 3 stents, HTN, Atrial flutter, cardiomyopathy, stroke, CHF, and atrial fibrillation who presents to the Emergency Department complaining of gradual onset, constant, left lateral chest pain that began last night. Pt woke up this morning and informed staff at [facility] that he was having the pain, prompting them to call EMS. There are no modifying factors to the pain. The pain does not radiate. No recent falls or injuries to the chest. He has had similar pain in the past with the most recent episode being last week. He also complains of mild diaphoresis and nausea. Pt was given 1 NTG at [facility] and 2 NTG en route along with 324 mg Aspiring with mild relief. He has hx of thoracic aortic aneurysm and has been seen by his cardiothoracic surgeon last week. Denies shortness of breath, lightheadedness, dizziness, vomiting, or any other associated symptoms. Pt is currently on Xarelto.   Past Medical History  Diagnosis Date  . Arteriosclerotic cardiovascular disease (ASCVD) 2006    Stents to unknown arteries by cardiologist at [facility] ; echo in 2006-normal EF, moderate LVH, mild inferior hypokinesis  . Hypertension   . 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 Hypothyroidism   . Atrial flutter (HCC) 2012    Left atrial appendage thrombus with SEC; converted on amiodarone to marked SB  . Cardiomyopathy 2012     CHF in 10/2010; possibly tachycardia mediated; EF of 20-25%  . Chronic anticoagulation 2012    Discontinued  . Tobacco abuse     40 pack years; Tapering use; 10/2012:1/5 pack per day  . Gout   . Benign prostatic hypertrophy   . Nephrolithiasis     Lithotripsy; left ureteral calculus in 10/2012  . Stroke (HCC)   . Gout   . Anxiety   . Major depressive disorder (HCC)   . CHF (congestive heart failure) (HCC)   . Hyperlipidemia   . Peripheral vascular disease (HCC)   . Atrial fibrillation (HCC)   . GERD (gastroesophageal reflux disease)   . Muscle weakness   . Aortic aneurysm, thoracic [facility])    Past Surgical History  Procedure Laterality Date  . Lithotripsy    . Cardioversion    . Coronary stent placement  2003   Family History  Problem Relation Age of Onset  . Dementia Mother   . Dementia Father    Social History  Substance Use Topics  . Smoking status: Former Smoker -- 0.50 packs/day for 40 years    Types: Cigarettes    Quit date: 12/04/2012  . Smokeless tobacco: Never Used     Comment: 10/2012:1/5 pack per day  . Alcohol Use: No    Review of Systems A complete 10 system review of systems was obtained and all systems are negative except as noted in the HPI and PMH.   Allergies  Penicillins  Home Medications  Prior to Admission medications   Medication Sig Start Date End Date Taking? Authorizing Provider  allopurinol (ZYLOPRIM) 100 MG tablet Take 100 mg by mouth daily.   Yes Historical Provider, MD  amiodarone (PACERONE) 200 MG tablet TAKE ONE TABLET BY MOUTH EVERY DAY 09/03/11  Yes Kathlen Brunswickobert M Rothbart, MD  atorvastatin (LIPITOR) 40 MG tablet Take 40 mg by mouth daily.     Yes Historical Provider, MD  Cholecalciferol 5000 UNITS TABS Take 1 tablet by mouth every 30 (thirty) days.   Yes Historical Provider, MD  furosemide (LASIX) 40 MG tablet Take 40 mg by mouth daily.   Yes Historical Provider, MD  levothyroxine (SYNTHROID, LEVOTHROID) 175 MCG tablet Take 175 mcg by  mouth daily before breakfast.   Yes Historical Provider, MD  lisinopril (PRINIVIL,ZESTRIL) 5 MG tablet Take 5 mg by mouth daily.   Yes Historical Provider, MD  magnesium hydroxide (MILK OF MAGNESIA) 400 MG/5ML suspension Take 30 mLs by mouth every 2 (two) hours as needed for indigestion.    Yes Historical Provider, MD  metoprolol (LOPRESSOR) 50 MG tablet Take 50 mg by mouth 2 (two) times daily.   Yes Historical Provider, MD  nitroGLYCERIN (NITROSTAT) 0.4 MG SL tablet Place 0.4 mg under the tongue every 5 (five) minutes as needed.     Yes Historical Provider, MD  Olopatadine HCl 0.2 % SOLN Apply 1 drop to eye.   Yes Historical Provider, MD  Omega-3 Fatty Acids (SUPER OMEGA-EPA) 1000 MG CAPS Take 2 capsules by mouth 2 (two) times daily.    Yes Historical Provider, MD  omeprazole (PRILOSEC) 20 MG capsule Take 20 mg by mouth daily.   Yes Historical Provider, MD  polyethylene glycol (MIRALAX / GLYCOLAX) packet Take 17 g by mouth every other day.   Yes Historical Provider, MD  rivaroxaban (XARELTO) 20 MG TABS tablet Take 20 mg by mouth daily with supper.   Yes Historical Provider, MD  senna (SENOKOT) 8.6 MG tablet Take 1 tablet by mouth 2 (two) times daily.   Yes Historical Provider, MD  solifenacin (VESICARE) 5 MG tablet Take 5 mg by mouth daily.    Yes Historical Provider, MD  tamsulosin (FLOMAX) 0.4 MG CAPS Take by mouth daily.   Yes Historical Provider, MD  traMADol (ULTRAM) 50 MG tablet Take 50 mg by mouth every 8 (eight) hours.   Yes Historical Provider, MD   BP 188/83 mmHg  Pulse 54  Temp(Src) 97.6 F (36.4 C) (Oral)  Resp 18  Ht 6\' 1"  (1.854 m)  Wt 249 lb 3.2 oz (113.036 kg)  BMI 32.88 kg/m2  SpO2 100%   Physical Exam  Constitutional: He is oriented to person, place, and time. He appears well-developed and well-nourished. No distress.  HENT:  Head: Normocephalic and atraumatic.  Mouth/Throat: Oropharynx is clear and moist. No oropharyngeal exudate.  Eyes: Conjunctivae and EOM are  normal. Pupils are equal, round, and reactive to light.  Neck: Normal range of motion. Neck supple.  No meningismus.  Cardiovascular: Normal rate, regular rhythm, normal heart sounds and intact distal pulses.   No murmur heard. Pulses:      Radial pulses are 2+ on the right side, and 2+ on the left side.  Equal radial pulses  Pulmonary/Chest: Effort normal and breath sounds normal. No respiratory distress. He exhibits tenderness.  Left chest Keiffer tenderness  Abdominal: Soft. There is no tenderness. There is no rebound and no guarding.  Reducible ventral hernia  Musculoskeletal: Normal range of motion. He exhibits edema. He  exhibits no tenderness.  Neurological: He is alert and oriented to person, place, and time. No cranial nerve deficit. He exhibits normal muscle tone. Coordination normal.  l sided weakness at baseline  Skin: Skin is warm.  Psychiatric: He has a normal mood and affect. His behavior is normal.  Nursing note and vitals reviewed.   ED Course  Procedures (including critical care time)  DIAGNOSTIC STUDIES: Oxygen Saturation is 95% on RA, adequate by my interpretation.    COORDINATION OF CARE: 12:48 PM-Discussed treatment plan which includes CXR, Chem 8, Protime INR, BMP, CBC, and Troponin with pt at bedside and pt agreed to plan.   Labs Review Labs Reviewed  BASIC METABOLIC PANEL - Abnormal; Notable for the following:    Glucose, Bld 109 (*)    Creatinine, Ser 1.30 (*)    GFR calc non Af Amer 56 (*)    All other components within normal limits  CBC - Abnormal; Notable for the following:    RBC 4.16 (*)    All other components within normal limits  PROTIME-INR - Abnormal; Notable for the following:    Prothrombin Time 31.3 (*)    INR 3.09 (*)    All other components within normal limits  DIGOXIN LEVEL - Abnormal; Notable for the following:    Digoxin Level <0.2 (*)    All other components within normal limits  I-STAT CHEM 8, ED - Abnormal; Notable for the  following:    Chloride 100 (*)    Creatinine, Ser 1.40 (*)    Glucose, Bld 103 (*)    All other components within normal limits  MRSA PCR SCREENING  TROPONIN I  TROPONIN I  TROPONIN I    Imaging Review Dg Chest Portable 1 View  10/15/2015  CLINICAL DATA:  Chest pain since last night. History of thoracic aneurysm, hypertension and congestive heart failure. EXAM: PORTABLE CHEST 1 VIEW COMPARISON:  CT 08/30/2015.  Radiographs 07/02/2014. FINDINGS: 1245 hours. The heart size and mediastinal contours are stable. The heart is mildly enlarged. The lungs are clear. There is no pleural effusion or pneumothorax. The bones appear unremarkable. Telemetry leads overlie the chest. IMPRESSION: Stable chest.  No acute cardiopulmonary process. Electronically Signed   By: Carey Bullocks M.D.   On: 10/15/2015 12:59   Ct Angio Chest/abd/pel For Dissection W And/or W/wo  10/15/2015  CLINICAL DATA:  Recurrent chest pain since last night, temporarily relieved by nitroglycerin. History of thoracic aortic aneurysm. EXAM: CT ANGIOGRAPHY CHEST, ABDOMEN AND PELVIS TECHNIQUE: Multidetector CT imaging through the chest, abdomen and pelvis was performed using the standard protocol during bolus administration of intravenous contrast. Multiplanar reconstructed images and MIPs were obtained and reviewed to evaluate the vascular anatomy. CONTRAST:  100 ml Isovue-370. COMPARISON:  CT abdomen 02/12/2015.  Chest CTA 08/30/2015. FINDINGS: CTA CHEST FINDINGS Mediastinum: Pre contrast images demonstrate diffuse atherosclerosis, most advanced within the coronary arteries. There are no displaced intimal calcifications within the thoracic aorta. Postcontrast, the aortic lumen opacifies normally. There is no evidence of dissection. There is persistent dilatation of the aortic root, measuring up to 5.9 x 5.5 cm on image 83. The pulmonary arteries are suboptimally opacified with contrast. There is no evidence of acute pulmonary embolism. The  heart size is stable. There is no pericardial effusion. There are no enlarged mediastinal, hilar or axillary lymph nodes. The thyroid gland, trachea and esophagus demonstrate no significant findings. Lungs/Pleura: There is no pleural effusion.The lungs appear stable with mild biapical scarring and bibasilar atelectasis. There is  no confluent airspace opacity or suspicious pulmonary nodule. Musculoskeletal/Chest Bushnell: No chest Herrada lesion or acute osseous findings. Review of the MIP images confirms the above findings. CTA ABDOMEN AND PELVIS FINDINGS Hepatobiliary: The hepatic density is diffusely decreased consistent with steatosis. There is relative sparing with around the gallbladder. No focal hepatic lesions are identified. No evidence of gallstones, gallbladder [place] thickening or biliary dilatation. Pancreas: Unremarkable. No pancreatic ductal dilatation or surrounding inflammatory changes. Spleen: Normal in size without focal abnormality. Adrenals/Urinary Tract: Both adrenal glands appear normal. Both kidneys demonstrate cortical scarring and small cysts. There is no evidence of renal mass, urinary tract calculus or hydronephrosis. Stomach/Bowel: No evidence of bowel Elster thickening, distention or surrounding inflammatory change. Vascular/Lymphatic: There is diffuse atherosclerosis of the aorta, its branches and the iliac arteries. 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 There is no evidence of large vessel occlusion, aneurysm or dissection. There are no enlarged abdominal pelvic lymph nodes. Reproductive: The prostate gland and seminal vesicles appear unremarkable. Other: Small umbilical hernia containing only fat. There are postsurgical changes in the anterior abdominal Orellana. There is dependent edema within the subcutaneous fat of the back. Musculoskeletal: No acute or significant osseous findings. Review of the MIP images confirms the above findings. IMPRESSION: 1. Stable diffuse aortic and branch vessel  atherosclerosis. No evidence of aortic dissection or other acute vascular findings. 2. Stable dilatation of the aortic root compared with recent chest CTA of 6 weeks ago. 3. Hepatic steatosis, renal cortical thinning and probable small renal cysts. Electronically Signed   By: Carey Bullocks M.D.   On: 10/15/2015 17:10   I have personally reviewed and evaluated these images and lab results as part of my medical decision-making.   EKG Interpretation   Date/Time:  Monday Oct 15 2015 12:12:38 EDT Ventricular Rate:  52 PR Interval:  245 QRS Duration: 123 QT Interval:  458 QTC Calculation: 426 R Axis:   31 Text Interpretation:  Sinus rhythm Prolonged PR interval Abnormal T,  consider ischemia, diffuse leads Baseline wander in lead(s) II III aVF ,  V6 lateral ST depression, similar to previous Reconfirmed by Adom Schoeneck  MD,  Prudencio Velazco [phone]) on 10/15/2015 12:39:01 PM      MDM   Final diagnoses:  Chest pain, unspecified chest pain type   Left-sided chest pain since last night. He told his nursing home staff this morning. EMS gave him nitroglycerin which has improved the pain. History of thoracic aortic aneurysm and thoracic surgery last week and is being considered for surgical repair. EKG shows ST depressions laterally similar to previous. Chest Francis is tender.  CXR negative. Troponin negative. CTA shows stable aortic root dilation. No dissection. Pain appears to be reproducible on exam.  Pain resolved during stay in the ED.    Given cardiac history and possible EKG changes, will admit for rule out despite reproducibility of pain. D/w Dr. Arlean Hopping.     I personally performed the services described in this documentation, which was scribed in my presence. The recorded information has been reviewed and is accurate.     Glynn Octave, MD 10/16/15 [phone]

## 2015-10-16 DIAGNOSIS — G8194 Hemiplegia, unspecified affecting left nondominant side: Secondary | ICD-10-CM

## 2015-10-16 DIAGNOSIS — I712 Thoracic aortic aneurysm, without rupture: Secondary | ICD-10-CM | POA: Diagnosis not present

## 2015-10-16 DIAGNOSIS — R072 Precordial pain: Secondary | ICD-10-CM | POA: Diagnosis not present

## 2015-10-16 DIAGNOSIS — E039 Hypothyroidism, unspecified: Secondary | ICD-10-CM | POA: Diagnosis not present

## 2015-10-16 DIAGNOSIS — I4892 Unspecified atrial flutter: Secondary | ICD-10-CM

## 2015-10-16 DIAGNOSIS — R079 Chest pain, unspecified: Secondary | ICD-10-CM | POA: Diagnosis not present

## 2015-10-16 DIAGNOSIS — N183 Chronic kidney disease, stage 3 (moderate): Secondary | ICD-10-CM | POA: Diagnosis not present

## 2015-10-16 DIAGNOSIS — I2510 Atherosclerotic heart disease of native coronary artery without angina pectoris: Secondary | ICD-10-CM | POA: Diagnosis not present

## 2015-10-16 DIAGNOSIS — F329 Major depressive disorder, single episode, unspecified: Secondary | ICD-10-CM | POA: Diagnosis not present

## 2015-10-16 DIAGNOSIS — I10 Essential (primary) hypertension: Secondary | ICD-10-CM

## 2015-10-16 LAB — TROPONIN I: Troponin I: <0.03 ng/mL (ref <0.031)

## 2015-10-16 MED ORDER — AMLODIPINE BESYLATE 5 MG PO TABS: 5.0000 mg | ORAL_TABLET | Freq: Every day | ORAL | Status: DC

## 2015-10-16 MED ORDER — MORPHINE SULFATE (PF) 2 MG/ML IV SOLN
2.0000 mg | INTRAVENOUS | Status: DC | PRN
  Administered 2015-10-16: 2 mg via INTRAVENOUS
  Filled 2015-10-16: qty 1

## 2015-10-16 MED ORDER — AMLODIPINE BESYLATE 5 MG PO TABS
5.0000 mg | ORAL_TABLET | Freq: Every day | ORAL | Status: DC
  Administered 2015-10-16: 5 mg via ORAL
  Filled 2015-10-16: qty 1

## 2015-10-16 MED ORDER — OXYCODONE-ACETAMINOPHEN 5-325 MG PO TABS: 1.0000 | ORAL_TABLET | ORAL | Status: DC | PRN

## 2015-10-16 NOTE — Clinical Social Work Note (Signed)
Clinical Social Work Assessment  Patient Details  Name: Sean BoatmanDanny L Hardy MRN: [id number] Date of Birth: 01/18/1951  Date of referral:  10/16/15               Reason for consult:  Discharge Planning                Permission sought to share information with:    Permission granted to share information::     Name::        Agency::     Relationship::     Contact Information:     Housing/Transportation Living arrangements for the past 2 months:  Skilled Nursing Facility Source of Information:  Guardian, Facility Patient Interpreter Needed:  None Criminal Activity/Legal Involvement Pertinent to Current Situation/Hospitalization:  No - Comment as needed Significant Relationships:  None Lives with:  Facility Resident Do you feel safe going back to the place where you live?  Yes Need for family participation in patient care:  No (Coment)  Care giving concerns:  None reported. [facility]    Social Worker assessment / plan:  CS[facility] who reports pt has been a resident there since April 2015. He requires very limited assist with ADLs. Pt has no family involved. He is nursing level of care and okay to return. His guardian [facility] [facility]. CSW spoke with Efraim KaufmannMelissa who indicates they have been guardian since June 2015. She states that pt saw thoracic surgeon last week and plan is for surgery in the near future. Pt came to ED due to chest pain. CSW will continue to follow.   Employment status:  Disabled (Comment on whether or not currently receiving Disability) Insurance information:  Medicare PT Recommendations:  Not assessed at this time Information / Referral to community resources:  Other (Comment Required) ([facility])  Patient/Family's Response to care:  Pt's guardian [facility] medically [facility].   Patient/Family's Understanding of and Emotional Response to Diagnosis, Current  Treatment, and Prognosis:  Pt's guardian is very aware of pt's medical history and provided information regarding previous appointments.   Emotional Assessment Appearance:  Appears stated age Attitude/Demeanor/Rapport:  Unable to Assess Affect (typically observed):  Unable to Assess Orientation:    Alcohol / Substance use:  Not Applicable Psych involvement (Current and /or in the community):  No (Comment)  Discharge Needs  Concerns to be addressed:  Discharge Planning Concerns Readmission within the last 30 days:  No Current discharge risk:  Cognitively Impaired Barriers to Discharge:  Continued Medical Work up   Karn CassisStultz, Ervan Heber Shanaberger, LCSW 10/16/2015, 10:16 AM [phone]

## 2015-10-16 NOTE — Care Management Obs Status (Signed)
MEDICARE OBSERVATION STATUS NOTIFICATION   Patient Details  Name: Sean Hardy MRN: [id number] Date of Birth: 01/21/1951   Medicare Observation Status Notification Given:  Yes    Adonis HugueninBerkhead, Vaness Jelinski L, RN 10/16/2015, 11:42 AM

## 2015-10-16 NOTE — Progress Notes (Signed)
[facility]. Report called,and given to Nemiah CommanderJean Webber RN. Vital signs stable. CSW Barnie AldermanMelissa Price, notified that patient was being discharged. Staff accompanied patient to awaiting vehicle,

## 2015-10-16 NOTE — Care Management Note (Signed)
Case Management Note  Patient Details  Name: Sheralyn BoatmanDanny L Armstrong MRN: [id number] Date of Birth: 02/05/1951  Subjective/Objective:Spoke withpatient who is [facility] > patietn answers question appropirately.  [facility]. No further CM needs identified. CSW is following.                    Action/Plan: [facility]   Expected Discharge Date:                  Expected Discharge Plan:  Assisted Living / Rest Home  In-House Referral:  Clinical Social Work  Discharge planning Services  CM Consult  Post Acute Care Choice:    Choice offered to:     DME Arranged:    DME Agency:     HH Arranged:    HH Agency:     Status of Service:  Completed, signed off  Medicare Important Message Given:    Date Medicare IM Given:    Medicare IM give by:    Date Additional Medicare IM Given:    Additional Medicare Important Message give by:     If discussed at Long Length of Stay Meetings, dates discussed:    Additional Comments:  Adonis HugueninBerkhead, Annalese Stiner L, RN 10/16/2015, 11:40 AM

## 2015-10-16 NOTE — Clinical Social Work Note (Signed)
[facility]. CSW notified pt's guardian, Barnie AldermanMelissa Price by [employer]. Facility aware and will pick up pt this afternoon. Facility agreeable to no FL2 due to <24 hour observation.   Derenda FennelKara Marcelene Weidemann, LCSW [phone]

## 2015-10-16 NOTE — Discharge Summary (Signed)
Physician Discharge Summary  Sean BoatmanDanny L Hardy ZOX:[id number] DOB: 01/30/1951 DOA: 10/15/2015  PCP: Sean Hardy,[name], MD  Admit date: 10/15/2015 Discharge date: 10/16/2015  Time spent: 35 minutes  Recommendations for Outpatient Follow-up:  1. Follow up with PCP in 1-2 weeks 2. Follow up with outpatient Cardiology in [place] as previously scheduled. 3. Patient will need outpatient ECHO and cardiac cath prior to any cardiac surgery.   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 In the ED, EKG revealed TWI in inferolateral leads. Troponin remained negative. He has been referred for admission.   Hospital Course:  Patient was admitted for chest pain. He was monitored on telemetry and cardiac enzymes negative for ACS. CT angio for chest was negative for aortic dissection and PE. Case discussed with Dr. Purvis Hardy, cardiology, who felt no further inpatient cardiac workup was necessary and that patient can follow up as outpatient. Chest pain is very atypical and is completely reproducible on palpation. He does not have any EKG changes consistent with ischemia. Patient will follow up cardiology as outpatient.     1. TAA - seen 5/22 by Dr. Laneta Hardy. Plans for aortic root replacement to prevent prog enlargement, dissection or rupture. Noted that pt is losing his current cardiologist and needs to establish care w new  cardiologist. Will need ECHO and cath and tune-up prior to surgery.  2. Atrial flutter, in SR. Currently anticoagulated on Xarelto. 3. Essential HTN, Norvasc added to antihypertensive regimen.  4. Hx of CVA with residual left hemiparesis. Noted 5. CKD stage 3. Creatinine stable.   Procedures:  None  Consultations:  None   Discharge Exam: Filed Vitals:   10/16/15 0629 10/16/15 1317  BP: 170/88 179/79  Pulse:  52  Temp: 98 F (36.7 C) 97.5 F (36.4 C)  Resp: 18 18    6. General: NAD, looks comfortable 7. Cardiovascular: RRR, S1, S2  8. Respiratory: clear bilaterally, No wheezing, rales or rhonchi 9. Abdomen: soft, non tender, no distention , bowel sounds normal 10. Musculoskeletal: Point tenderness in left lateral chest.   Discharge Instructions   Discharge Instructions    Diet - low sodium heart healthy    Complete by:  As directed      Increase activity slowly    Complete by:  As directed           Current Discharge Medication List    START taking these medications   Details  amLODipine (NORVASC) 5 MG tablet Take 1 tablet (5 mg total) by mouth daily. Qty: 30 tablet, Refills: 0    oxyCODONE-acetaminophen (ROXICET) 5-325 MG tablet Take 1-2 tablets by mouth every 4 (four) hours as needed for severe pain. Qty: 30 tablet, Refills: 0      CONTINUE these medications which have NOT CHANGED   Details  allopurinol (ZYLOPRIM) 100 MG tablet Take 100 mg by  mouth daily.    amiodarone (PACERONE) 200 MG tablet TAKE ONE TABLET BY MOUTH EVERY DAY Qty: 30 tablet, Refills: 6    atorvastatin (LIPITOR) 40 MG tablet Take 40 mg by mouth daily.      Cholecalciferol 5000 UNITS TABS Take 1 tablet by mouth every 30 (thirty) days.    furosemide (LASIX) 40 MG tablet Take 40 mg by mouth daily.    levothyroxine (SYNTHROID, LEVOTHROID) 175 MCG tablet Take 175 mcg by mouth daily before breakfast.    lisinopril (PRINIVIL,ZESTRIL) 5 MG tablet Take 5 mg by mouth daily.     magnesium hydroxide (MILK OF MAGNESIA) 400 MG/5ML suspension Take 30 mLs by mouth every 2 (two) hours as needed for indigestion.     metoprolol (LOPRESSOR) 50 MG tablet Take 50 mg by mouth 2 (two) times daily.    nitroGLYCERIN (NITROSTAT) 0.4 MG SL tablet Place 0.4 mg under the tongue every 5 (five) minutes as needed.      Olopatadine HCl 0.2 % SOLN Apply 1 drop to eye.    Omega-3 Fatty Acids (SUPER OMEGA-EPA) 1000 MG CAPS Take 2 capsules by mouth 2 (two) times daily.     omeprazole (PRILOSEC) 20 MG capsule Take 20 mg by mouth daily.    polyethylene glycol (MIRALAX / GLYCOLAX) packet Take 17 g by mouth every other day.    rivaroxaban (XARELTO) 20 MG TABS tablet Take 20 mg by mouth daily with supper.    senna (SENOKOT) 8.6 MG tablet Take 1 tablet by mouth 2 (two) times daily.    solifenacin (VESICARE) 5 MG tablet Take 5 mg by mouth daily.     tamsulosin (FLOMAX) 0.4 MG CAPS Take by mouth daily.    traMADol (ULTRAM) 50 MG tablet Take 50 mg by mouth every 8 (eight) hours.       Allergies  Allergen Reactions  . Penicillins Other (See Comments)    Unknown. Has patient had a PCN reaction causing immediate rash, facial/tongue/throat swelling, SOB or lightheadedness with hypotension: No Has patient had a PCN reaction causing severe rash involving mucus membranes or skin necrosis: No Has patient had a PCN reaction that required hospitalization No Has patient had a PCN reaction occurring within the last 10 years: No If all of the above answers are "NO", then may proceed with Cephalosporin use.       The results of significant diagnostics from this hospitalization (including imaging, microbiology, ancillary and laboratory) are listed below for reference.    Significant Diagnostic Studies: Dg Chest Portable 1 View  10/15/2015  CLINICAL DATA:  Chest pain since last night. History of thoracic aneurysm, hypertension and congestive heart failure. EXAM: PORTABLE CHEST 1 VIEW COMPARISON:   CT 08/30/2015.  Radiographs 07/02/2014. FINDINGS: 1245 hours. The heart size and mediastinal contours are stable. The heart is mildly enlarged. The lungs are clear. There is no pleural effusion or pneumothorax. The bones appear unremarkable. Telemetry leads overlie the chest. IMPRESSION: Stable chest.  No acute cardiopulmonary process. Electronically Signed   By: Carey Bullocks M.D.   On: 10/15/2015 12:59   Ct Angio Chest/abd/pel For Dissection W And/or W/wo  10/15/2015  CLINICAL DATA:  Recurrent chest pain since last night, temporarily relieved by nitroglycerin. History of thoracic aortic aneurysm. EXAM: CT ANGIOGRAPHY CHEST, ABDOMEN AND PELVIS TECHNIQUE: Multidetector CT imaging through the chest, abdomen and pelvis was performed using the standard protocol during bolus administration of intravenous contrast. Multiplanar reconstructed images and MIPs were obtained and reviewed to evaluate the vascular anatomy. CONTRAST:  100 ml Isovue-370. COMPARISON:  CT abdomen 02/12/2015.  Chest CTA 08/30/2015. FINDINGS: CTA CHEST FINDINGS Mediastinum: Pre contrast images demonstrate diffuse atherosclerosis, most advanced within the coronary arteries. There are no displaced intimal calcifications within the thoracic aorta. Postcontrast, the aortic lumen opacifies normally. There is no evidence of dissection. There is persistent dilatation of the aortic root, measuring up to 5.9 x 5.5 cm on image 83. The pulmonary arteries are suboptimally opacified with contrast. There is no evidence of acute pulmonary embolism. The heart size is stable. There is no pericardial effusion. There are no enlarged mediastinal, hilar or axillary lymph nodes. The thyroid gland, trachea and esophagus demonstrate no significant findings. Lungs/Pleura: There is no pleural effusion.The lungs appear stable with mild biapical scarring and bibasilar atelectasis. There is no confluent airspace opacity or suspicious pulmonary nodule. Musculoskeletal/Chest  Blow: No chest Durnil lesion or acute osseous findings. Review of the MIP images confirms the above findings. CTA ABDOMEN AND PELVIS FINDINGS Hepatobiliary: The hepatic density is diffusely decreased consistent with steatosis. There is relative sparing with around the gallbladder. No focal hepatic lesions are identified. No evidence of gallstones, gallbladder Boettger thickening or biliary dilatation. Pancreas: Unremarkable. No pancreatic ductal dilatation or surrounding inflammatory changes. Spleen: Normal in size without focal abnormality. Adrenals/Urinary Tract: Both adrenal glands appear normal. Both kidneys demonstrate cortical scarring and small cysts. There is no evidence of renal mass, urinary tract calculus or hydronephrosis. Stomach/Bowel: No evidence of bowel Cadavid thickening, distention or surrounding inflammatory change. Vascular/Lymphatic: There is diffuse atherosclerosis of the aorta, its branches and the iliac arteries. 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 There is no evidence of large vessel occlusion, aneurysm or dissection. There are no enlarged abdominal pelvic lymph nodes. Reproductive: The prostate gland and seminal vesicles appear unremarkable. Other: Small umbilical hernia containing only fat. There are postsurgical changes in the anterior abdominal Lucken. There is dependent edema within the subcutaneous fat of the back. Musculoskeletal: No acute or significant osseous findings. Review of the MIP images confirms the above findings. IMPRESSION: 1. Stable diffuse aortic and branch vessel atherosclerosis. No evidence of aortic dissection or other acute vascular findings. 2. Stable dilatation of the aortic root compared with recent chest CTA of 6 weeks ago. 3. Hepatic steatosis, renal cortical thinning and probable small renal cysts. Electronically Signed   By: Carey Bullocks M.D.   On: 10/15/2015 17:10    Microbiology: Recent Results (from the past 240 hour(s))  MRSA PCR Screening     Status:  None   Collection Time: 10/15/15  8:55 PM  Result Value Ref Range Status   MRSA by PCR NEGATIVE NEGATIVE Final    Comment:        The GeneXpert MRSA Assay (FDA approved for NASAL specimens only), is one component of a comprehensive MRSA colonization surveillance program. It is not intended to diagnose MRSA infection nor to guide or monitor treatment for MRSA infections.      Labs: Basic Metabolic Panel:  Recent Labs Lab 10/15/15 1238 10/15/15 1252  NA 138 141  K 4.1 4.1  CL 103 100*  CO2 28  --   GLUCOSE 109* 103*  BUN 16 15  CREATININE 1.30* 1.40*  CALCIUM 9.1  --    CBC:  Recent Labs Lab 10/15/15 1238 10/15/15 1252  WBC 6.2  --   HGB 13.7 14.3  HCT 40.5 42.0  MCV 97.4  --   PLT 200  --    Cardiac Enzymes:  Recent Labs Lab 10/15/15 1238 10/15/15  1641 10/16/15 0659  TROPONINI <0.03 <0.03 <0.03    Signed: Erick Blinks, MD   [facility] 10/16/2015, 1:40 PM  By signing my name below, I, Zadie Cleverly, attest that this documentation has been prepared under the direction and in the presence of Erick Blinks, MD. Electronically signed: Zadie Cleverly, Scribe. 10/16/2015 12:38pm   I, Dr. Erick Blinks, personally performed the services described in this documentaiton. All medical record entries made by the scribe were at my direction and in my presence. I have reviewed the chart and agree that the record reflects my personal performance and is accurate and complete  Erick Blinks, MD, 10/16/2015 1:40 PM

## 2015-10-18 ENCOUNTER — Ambulatory Visit (INDEPENDENT_AMBULATORY_CARE_PROVIDER_SITE_OTHER): Payer: Medicare Other | Admitting: Cardiology

## 2015-10-18 ENCOUNTER — Encounter: Payer: Self-pay | Admitting: Cardiology

## 2015-10-18 VITALS — BP 139/85 | HR 50 | Ht 73.0 in | Wt 250.0 lb

## 2015-10-18 DIAGNOSIS — I4892 Unspecified atrial flutter: Secondary | ICD-10-CM | POA: Diagnosis not present

## 2015-10-18 DIAGNOSIS — I10 Essential (primary) hypertension: Secondary | ICD-10-CM

## 2015-10-18 DIAGNOSIS — I712 Thoracic aortic aneurysm, without rupture, unspecified: Secondary | ICD-10-CM

## 2015-10-18 DIAGNOSIS — I2510 Atherosclerotic heart disease of native coronary artery without angina pectoris: Secondary | ICD-10-CM

## 2015-10-18 NOTE — Progress Notes (Signed)
Patient ID: Sean Hardy, male   DOB: 03/17/1951, 65 y.o.   MRN: [id number]     Clinical Summary Sean Hardy is a 64 y.o.male last seen by Sean Hardy, this is our first visit together. He is seen for the following medical problems. He was most recently seen by Sean Hardy at [facility] a few months ago.   1. CAD - fairly unclear history from available records, appears he had coroanry stents done at [facility] several years ago. It looks like he had a PCI to his LAD in 2003.   - [facility] with atypical chest pain, reproducible to palpation according to notes. Denies any recurrent symptoms    2. Aflutter - reports occasional palpitations, overall infrequent. No bleeding troubles on xarelto. Has been on amio and lopressor.   3. HTN - compliant with meds  4. History of cardiomyopathy - 10/25/2010 echo LVEF 15% - 11/15/10 LVEF 55-60% - Echocardiogram December 2014 and ejection fraction 55-60%, dilated aortic root (CT scan below), moderate lae,1+ MR, G1DD   - denies any SOB, DOE, LE edema  5. Thoracic aneurysm - followed by CT surgery, being considered for repair.   Past Medical History  Diagnosis Date  . Arteriosclerotic cardiovascular disease (ASCVD) 2006    [facility] to unknown arteries [facility] ; echo in 2006-normal EF, moderate LVH, mild inferior hypokinesis  . Hypertension   . 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 Hypothyroidism   . Atrial flutter (HCC) 2012    Left atrial appendage thrombus with SEC; converted on amiodarone to marked SB  . Cardiomyopathy 2012    CHF in 10/2010; possibly tachycardia mediated; EF of 20-25%  . Chronic anticoagulation 2012    Discontinued  . Tobacco abuse     40 pack years; Tapering use; 10/2012:1/5 pack per day  . Gout   . Benign prostatic hypertrophy   . Nephrolithiasis     Lithotripsy; left ureteral calculus in 10/2012  . Stroke (HCC)   . Gout   . Anxiety   .  Major depressive disorder (HCC)   . CHF (congestive heart failure) (HCC)   . Hyperlipidemia   . Peripheral vascular disease (HCC)   . Atrial fibrillation (HCC)   . GERD (gastroesophageal reflux disease)   . Muscle weakness   . Aortic aneurysm, thoracic (HCC)      Allergies  Allergen Reactions  . Penicillins Other (See Comments)    Unknown. Has patient had a PCN reaction causing immediate rash, facial/tongue/throat swelling, SOB or lightheadedness with hypotension: No Has patient had a PCN reaction causing severe rash involving mucus membranes or skin necrosis: No Has patient had a PCN reaction that required hospitalization No Has patient had a PCN reaction occurring within the last 10 years: No If all of the above answers are "NO", then may proceed with Cephalosporin use.      Current Outpatient Prescriptions  Medication Sig Dispense Refill  . allopurinol (ZYLOPRIM) 100 MG tablet Take 100 mg by mouth daily.    Marland Kitchen. amiodarone (PACERONE) 200 MG tablet TAKE ONE TABLET BY MOUTH EVERY DAY 30 tablet 6  . amLODipine (NORVASC) 5 MG tablet Take 1 tablet (5 mg total) by mouth daily. 30 tablet 0  . atorvastatin (LIPITOR) 40 MG tablet Take 40 mg by mouth daily.      . Cholecalciferol 5000 UNITS TABS Take 1 tablet by mouth every 30 (thirty) days.    .Marland Kitchen  furosemide (LASIX) 40 MG tablet Take 40 mg by mouth daily.    Marland Kitchen levothyroxine (SYNTHROID, LEVOTHROID) 175 MCG tablet Take 175 mcg by mouth daily before breakfast.    . lisinopril (PRINIVIL,ZESTRIL) 5 MG tablet Take 5 mg by mouth daily.    . magnesium hydroxide (MILK OF MAGNESIA) 400 MG/5ML suspension Take 30 mLs by mouth every 2 (two) hours as needed for indigestion.     . metoprolol (LOPRESSOR) 50 MG tablet Take 50 mg by mouth 2 (two) times daily.    . nitroGLYCERIN (NITROSTAT) 0.4 MG SL tablet Place 0.4 mg under the tongue every 5 (five) minutes as needed.      . Olopatadine HCl 0.2 % SOLN Apply 1 drop to eye.    . Omega-3 Fatty Acids (SUPER  OMEGA-EPA) 1000 MG CAPS Take 2 capsules by mouth 2 (two) times daily.     Marland Kitchen omeprazole (PRILOSEC) 20 MG capsule Take 20 mg by mouth daily.    Marland Kitchen oxyCODONE-acetaminophen (ROXICET) 5-325 MG tablet Take 1-2 tablets by mouth every 4 (four) hours as needed for severe pain. 30 tablet 0  . polyethylene glycol (MIRALAX / GLYCOLAX) packet Take 17 g by mouth every other day.    . rivaroxaban (XARELTO) 20 MG TABS tablet Take 20 mg by mouth daily with supper.    . senna (SENOKOT) 8.6 MG tablet Take 1 tablet by mouth 2 (two) times daily.    . solifenacin (VESICARE) 5 MG tablet Take 5 mg by mouth daily.     . tamsulosin (FLOMAX) 0.4 MG CAPS Take by mouth daily.    . traMADol (ULTRAM) 50 MG tablet Take 50 mg by mouth every 8 (eight) hours.     No current facility-administered medications for this visit.     Past Surgical History  Procedure Laterality Date  . Lithotripsy    . Cardioversion    . Coronary stent placement  2003     Allergies  Allergen Reactions  . Penicillins Other (See Comments)    Unknown. Has patient had a PCN reaction causing immediate rash, facial/tongue/throat swelling, SOB or lightheadedness with hypotension: No Has patient had a PCN reaction causing severe rash involving mucus membranes or skin necrosis: No Has patient had a PCN reaction that required hospitalization No Has patient had a PCN reaction occurring within the last 10 years: No If all of the above answers are "NO", then may proceed with Cephalosporin use.       Family History  Problem Relation Age of Onset  . Dementia Mother   . Dementia Father      Social History Sean Hardy reports that he quit smoking about 2 years ago. His smoking use included Cigarettes. He has a 20 pack-year smoking history. He has never used smokeless tobacco. Sean Hardy reports that he does not drink alcohol.   Review of Systems CONSTITUTIONAL: No weight loss, fever, chills, weakness or fatigue.  HEENT: Eyes: No visual loss,  blurred vision, double vision or yellow sclerae.No hearing loss, sneezing, congestion, runny nose or sore throat.  SKIN: No rash or itching.  CARDIOVASCULAR: per HPI RESPIRATORY: No shortness of breath, cough or sputum.  GASTROINTESTINAL: No anorexia, nausea, vomiting or diarrhea. No abdominal pain or blood.  GENITOURINARY: No burning on urination, no polyuria NEUROLOGICAL: No headache, dizziness, syncope, paralysis, ataxia, numbness or tingling in the extremities. No change in bowel or bladder control.  MUSCULOSKELETAL: No muscle, back pain, joint pain or stiffness.  LYMPHATICS: No enlarged nodes. No history of splenectomy.  PSYCHIATRIC: No history of depression or anxiety.  ENDOCRINOLOGIC: No reports of sweating, cold or heat intolerance. No polyuria or polydipsia.  Marland Kitchen   Physical Examination Filed Vitals:   10/18/15 1514  BP: 139/85  Pulse: 50   Filed Vitals:   10/18/15 1514  Height:  (1.854 m)  Weight: 250 lb (113.399 kg)    Gen: resting comfortably, no acute distress HEENT: no scleral icterus, pupils equal round and reactive, no palptable cervical adenopathy,  CV:: RRR, no m/r/g,n o jvd Resp: Clear to auscultation bilaterally GI: abdomen is soft, non-tender, non-distended, normal bowel sounds, no hepatosplenomegaly MSK: extremities are warm, no edema.  Skin: warm, no rash Neuro:  no focal deficits Psych: appropriate affect      Assessment and Plan   1. Thoracic aortic aneurysm - being considered for repair. We will obtain echo and LHC/RHC as part of preopeartive workup. Await echo results first, then arrange LHC/RHC   2. CAD - no current symptoms, conintue current meds  3. Aflutter - no significant symptoms, continue current meds.  4. HTN  - at goal, continue current meds.    F/u 4 months  Antoine Poche, M.D.

## 2015-10-18 NOTE — Patient Instructions (Signed)
Your physician has requested that you have an echocardiogram. Echocardiography is a painless test that uses sound waves to create images of your heart. It provides your doctor with information about the size and shape of your heart and how well your heart's chambers and valves are working. This procedure takes approximately one hour. There are no restrictions for this procedure. Office will contact with results via phone or letter.   Continue all current medications. Follow up in  4 months.   

## 2015-10-25 ENCOUNTER — Other Ambulatory Visit: Payer: Medicare Other

## 2015-10-31 ENCOUNTER — Other Ambulatory Visit: Payer: Self-pay

## 2015-10-31 ENCOUNTER — Ambulatory Visit (INDEPENDENT_AMBULATORY_CARE_PROVIDER_SITE_OTHER): Payer: Medicare Other

## 2015-10-31 DIAGNOSIS — I2510 Atherosclerotic heart disease of native coronary artery without angina pectoris: Secondary | ICD-10-CM

## 2015-10-31 LAB — ECHOCARDIOGRAM COMPLETE
E/e' ratio: 6.71
EWDT: 140 ms
FS: 18 % — AB (ref 28–44)
IV/PV OW: 0.94
LA diam end sys: 50 mm
LA vol index: 51.4 mL/m2
LADIAMINDEX: 2.04 cm/m2
LASIZE: 50 mm
LAVOL: 126 mL
LAVOLA4C: 141 mL
LV E/e' medial: 6.71
LV SIMPSON'S DISK: 60
LV e' LATERAL: 7.45 cm/s
LV sys vol index: 12 mL/m2
LVDIAVOL: 76 mL (ref 62–150)
LVDIAVOLIN: 31 mL/m2
LVEEAVG: 6.71
LVOT SV: 100 mL
LVOT area: 7.07 cm2
LVOT diameter: 30 mm
LVOT peak vel: 70.2 cm/s
LVSYSVOL: 30 mL (ref 21–61)
MV Dec: 140
MV pk E vel: 50 m/s
PW: 15.4 mm — AB (ref 0.6–1.1)
RV LATERAL S' VELOCITY: 10 cm/s
Stroke v: 45 ml
TAPSE: 26.3 mm
TDI e' lateral: 7.45
TDI e' medial: 5.9

## 2015-11-02 ENCOUNTER — Telehealth: Payer: Self-pay | Admitting: *Deleted

## 2015-11-02 ENCOUNTER — Telehealth: Payer: Self-pay | Admitting: Cardiology

## 2015-11-02 ENCOUNTER — Encounter: Payer: Self-pay | Admitting: *Deleted

## 2015-11-02 NOTE — Telephone Encounter (Signed)
-----   Message from Antoine PocheJonathan F Branch, MD sent at 11/01/2015  8:54 AM EDT ----- Echo shows overall normal heart pumping function. Patient needs a RHC/LHC as further workup for his upcoming aneurysm surgery, please arrange  Dominga FerryJ Branch MD

## 2015-11-02 NOTE — Telephone Encounter (Signed)
LHC/RHC 11/12/15 with Dr. Tresa EndoKelly

## 2015-11-02 NOTE — Telephone Encounter (Signed)
Sean Hardy from [facility] aware. Scheduled for 11/12/15 @ 9AM. Will fax instruction letter and giver verbal instructions and verify Xarelto instructions with provider

## 2015-11-12 ENCOUNTER — Encounter (HOSPITAL_COMMUNITY)
Admission: RE | Disposition: A | Discharge status: Home / Self Care | Payer: Self-pay | Source: Ambulatory Visit | Attending: Cardiovascular Disease

## 2015-11-12 ENCOUNTER — Observation Stay (HOSPITAL_COMMUNITY)
Admission: RE | Admit: 2015-11-12 | Discharge: 2015-11-13 | Disposition: A | Discharge status: Home / Self Care | Payer: Medicare Other | Source: Ambulatory Visit | Attending: Cardiovascular Disease | Admitting: Cardiovascular Disease

## 2015-11-12 ENCOUNTER — Other Ambulatory Visit: Payer: Self-pay

## 2015-11-12 ENCOUNTER — Encounter (HOSPITAL_COMMUNITY): Payer: Self-pay | Admitting: General Practice

## 2015-11-12 DIAGNOSIS — N183 Chronic kidney disease, stage 3 unspecified: Secondary | ICD-10-CM | POA: Diagnosis present

## 2015-11-12 DIAGNOSIS — Z720 Tobacco use: Secondary | ICD-10-CM | POA: Diagnosis present

## 2015-11-12 DIAGNOSIS — M109 Gout, unspecified: Secondary | ICD-10-CM | POA: Diagnosis not present

## 2015-11-12 DIAGNOSIS — I10 Essential (primary) hypertension: Secondary | ICD-10-CM | POA: Diagnosis present

## 2015-11-12 DIAGNOSIS — I2510 Atherosclerotic heart disease of native coronary artery without angina pectoris: Secondary | ICD-10-CM | POA: Diagnosis not present

## 2015-11-12 DIAGNOSIS — Z79899 Other long term (current) drug therapy: Secondary | ICD-10-CM | POA: Insufficient documentation

## 2015-11-12 DIAGNOSIS — I712 Thoracic aortic aneurysm, without rupture: Principal | ICD-10-CM | POA: Insufficient documentation

## 2015-11-12 DIAGNOSIS — Z955 Presence of coronary angioplasty implant and graft: Secondary | ICD-10-CM | POA: Insufficient documentation

## 2015-11-12 DIAGNOSIS — I132 Hypertensive heart and chronic kidney disease with heart failure and with stage 5 chronic kidney disease, or end stage renal disease: Secondary | ICD-10-CM | POA: Diagnosis not present

## 2015-11-12 DIAGNOSIS — K219 Gastro-esophageal reflux disease without esophagitis: Secondary | ICD-10-CM | POA: Diagnosis not present

## 2015-11-12 DIAGNOSIS — N400 Enlarged prostate without lower urinary tract symptoms: Secondary | ICD-10-CM | POA: Insufficient documentation

## 2015-11-12 DIAGNOSIS — I69354 Hemiplegia and hemiparesis following cerebral infarction affecting left non-dominant side: Secondary | ICD-10-CM | POA: Insufficient documentation

## 2015-11-12 DIAGNOSIS — I4892 Unspecified atrial flutter: Secondary | ICD-10-CM | POA: Diagnosis present

## 2015-11-12 DIAGNOSIS — Z7901 Long term (current) use of anticoagulants: Secondary | ICD-10-CM | POA: Insufficient documentation

## 2015-11-12 DIAGNOSIS — Q2543 Congenital aneurysm of aorta: Secondary | ICD-10-CM

## 2015-11-12 DIAGNOSIS — E039 Hypothyroidism, unspecified: Secondary | ICD-10-CM | POA: Diagnosis present

## 2015-11-12 DIAGNOSIS — Z87891 Personal history of nicotine dependence: Secondary | ICD-10-CM | POA: Insufficient documentation

## 2015-11-12 DIAGNOSIS — I2584 Coronary atherosclerosis due to calcified coronary lesion: Secondary | ICD-10-CM | POA: Insufficient documentation

## 2015-11-12 DIAGNOSIS — I4891 Unspecified atrial fibrillation: Secondary | ICD-10-CM | POA: Insufficient documentation

## 2015-11-12 DIAGNOSIS — I719 Aortic aneurysm of unspecified site, without rupture: Secondary | ICD-10-CM

## 2015-11-12 DIAGNOSIS — I7121 Aneurysm of the ascending aorta, without rupture: Secondary | ICD-10-CM

## 2015-11-12 DIAGNOSIS — I429 Cardiomyopathy, unspecified: Secondary | ICD-10-CM | POA: Diagnosis not present

## 2015-11-12 DIAGNOSIS — I509 Heart failure, unspecified: Secondary | ICD-10-CM | POA: Insufficient documentation

## 2015-11-12 DIAGNOSIS — Z8673 Personal history of transient ischemic attack (TIA), and cerebral infarction without residual deficits: Secondary | ICD-10-CM

## 2015-11-12 DIAGNOSIS — E785 Hyperlipidemia, unspecified: Secondary | ICD-10-CM | POA: Insufficient documentation

## 2015-11-12 HISTORY — DX: Pure hypercholesterolemia, unspecified: E78.00

## 2015-11-12 HISTORY — PX: CARDIAC CATHETERIZATION: SHX172

## 2015-11-12 HISTORY — PX: AORTOGRAM: SHX6300

## 2015-11-12 LAB — CBC
HCT: 38.4 % — ABNORMAL LOW (ref 39.0–52.0)
Hemoglobin: 12.9 g/dL — ABNORMAL LOW (ref 13.0–17.0)
MCH: 31.8 pg (ref 26.0–34.0)
MCHC: 33.6 g/dL (ref 30.0–36.0)
MCV: 94.6 fL (ref 78.0–100.0)
PLATELETS: 169 10*3/uL (ref 150–400)
RBC: 4.06 MIL/uL — ABNORMAL LOW (ref 4.22–5.81)
RDW: 13.5 % (ref 11.5–15.5)
WBC: 4.9 10*3/uL (ref 4.0–10.5)

## 2015-11-12 LAB — POCT I-STAT 3, VENOUS BLOOD GAS (G3P V)
Acid-base deficit: 1 mmol/L (ref 0.0–2.0)
BICARBONATE: 24.5 meq/L — AB (ref 20.0–24.0)
O2 SAT: 63 %
PCO2 VEN: 40.7 mmHg — AB (ref 45.0–50.0)
TCO2: 26 mmol/L (ref 0–100)
pH, Ven: 7.387 — ABNORMAL HIGH (ref 7.250–7.300)
pO2, Ven: 33 mmHg (ref 31.0–45.0)

## 2015-11-12 LAB — BASIC METABOLIC PANEL
ANION GAP: 5 (ref 5–15)
BUN: 11 mg/dL (ref 6–20)
CHLORIDE: 108 mmol/L (ref 101–111)
CO2: 25 mmol/L (ref 22–32)
Calcium: 8.9 mg/dL (ref 8.9–10.3)
Creatinine, Ser: 1.21 mg/dL (ref 0.61–1.24)
GFR calc Af Amer: >60 mL/min (ref >60)
Glucose, Bld: 105 mg/dL — ABNORMAL HIGH (ref 65–99)
POTASSIUM: 3.6 mmol/L (ref 3.5–5.1)
SODIUM: 138 mmol/L (ref 135–145)

## 2015-11-12 LAB — POCT I-STAT 3, ART BLOOD GAS (G3+)
BICARBONATE: 23.8 meq/L (ref 20.0–24.0)
O2 Saturation: 91 %
PCO2 ART: 35.6 mmHg (ref 35.0–45.0)
PO2 ART: 58 mmHg — AB (ref 80.0–100.0)
TCO2: 25 mmol/L (ref 0–100)
pH, Arterial: 7.432 (ref 7.350–7.450)

## 2015-11-12 SURGERY — RIGHT/LEFT HEART CATH AND CORONARY ANGIOGRAPHY

## 2015-11-12 MED ORDER — SODIUM CHLORIDE 0.9% FLUSH: 3.0000 mL | INTRAVENOUS | Status: DC | PRN

## 2015-11-12 MED ORDER — HYDRALAZINE HCL 20 MG/ML IJ SOLN
5.0000 mg | Freq: Once | INTRAMUSCULAR | Status: AC
  Administered 2015-11-12: 5 mg via INTRAVENOUS

## 2015-11-12 MED ORDER — ASPIRIN EC 81 MG PO TBEC
81.0000 mg | DELAYED_RELEASE_TABLET | Freq: Every day | ORAL | Status: DC
  Administered 2015-11-13: 81 mg via ORAL
  Filled 2015-11-12 (×2): qty 1

## 2015-11-12 MED ORDER — ISOSORBIDE MONONITRATE ER 30 MG PO TB24
30.0000 mg | ORAL_TABLET | Freq: Every day | ORAL | Status: DC
  Administered 2015-11-12 – 2015-11-13 (×2): 30 mg via ORAL
  Filled 2015-11-12 (×2): qty 1

## 2015-11-12 MED ORDER — SODIUM CHLORIDE 0.9% FLUSH
3.0000 mL | Freq: Two times a day (BID) | INTRAVENOUS | Status: DC
Start: 2015-11-12 — End: 2015-11-13
  Administered 2015-11-12: 3 mL via INTRAVENOUS

## 2015-11-12 MED ORDER — MORPHINE SULFATE (PF) 2 MG/ML IV SOLN
INTRAVENOUS | Status: AC
  Filled 2015-11-12: qty 1

## 2015-11-12 MED ORDER — AMLODIPINE BESYLATE 5 MG PO TABS
5.0000 mg | ORAL_TABLET | Freq: Once | ORAL | Status: AC
  Administered 2015-11-12: 5 mg via ORAL
  Filled 2015-11-12: qty 1

## 2015-11-12 MED ORDER — SODIUM CHLORIDE 0.9 % IV SOLN: 250.0000 mL | INTRAVENOUS | Status: DC | PRN

## 2015-11-12 MED ORDER — HYDRALAZINE HCL 20 MG/ML IJ SOLN
INTRAMUSCULAR | Status: AC
  Filled 2015-11-12: qty 1

## 2015-11-12 MED ORDER — HEPARIN SODIUM (PORCINE) 5000 UNIT/ML IJ SOLN
5000.0000 [IU] | Freq: Three times a day (TID) | INTRAMUSCULAR | Status: DC
  Administered 2015-11-13: 5000 [IU] via SUBCUTANEOUS
  Filled 2015-11-12: qty 1

## 2015-11-12 MED ORDER — MIDAZOLAM HCL 2 MG/2ML IJ SOLN
INTRAMUSCULAR | Status: AC
  Filled 2015-11-12: qty 2

## 2015-11-12 MED ORDER — ASPIRIN 81 MG PO TBEC: 81.0000 mg | DELAYED_RELEASE_TABLET | Freq: Every day | ORAL | Status: AC

## 2015-11-12 MED ORDER — FENTANYL CITRATE (PF) 100 MCG/2ML IJ SOLN
INTRAMUSCULAR | Status: DC | PRN
  Administered 2015-11-12 (×2): 25 ug via INTRAVENOUS

## 2015-11-12 MED ORDER — LIDOCAINE HCL (PF) 1 % IJ SOLN
INTRAMUSCULAR | Status: DC | PRN
  Administered 2015-11-12: 20 mL

## 2015-11-12 MED ORDER — SODIUM CHLORIDE 0.9% FLUSH
3.0000 mL | Freq: Two times a day (BID) | INTRAVENOUS | Status: DC
  Administered 2015-11-12 – 2015-11-13 (×2): 3 mL via INTRAVENOUS

## 2015-11-12 MED ORDER — HEPARIN (PORCINE) IN NACL 2-0.9 UNIT/ML-% IJ SOLN: INTRAMUSCULAR | Status: DC | PRN

## 2015-11-12 MED ORDER — IOPAMIDOL (ISOVUE-370) INJECTION 76%
INTRAVENOUS | Status: AC
  Filled 2015-11-12: qty 50

## 2015-11-12 MED ORDER — FENTANYL CITRATE (PF) 100 MCG/2ML IJ SOLN
INTRAMUSCULAR | Status: AC
  Filled 2015-11-12: qty 2

## 2015-11-12 MED ORDER — HEPARIN (PORCINE) IN NACL 2-0.9 UNIT/ML-% IJ SOLN
INTRAMUSCULAR | Status: AC
  Filled 2015-11-12: qty 1000

## 2015-11-12 MED ORDER — ONDANSETRON HCL 4 MG PO TABS: 4.0000 mg | ORAL_TABLET | Freq: Four times a day (QID) | ORAL | Status: DC | PRN

## 2015-11-12 MED ORDER — ACETAMINOPHEN 325 MG PO TABS: 650.0000 mg | ORAL_TABLET | ORAL | Status: DC | PRN

## 2015-11-12 MED ORDER — SODIUM CHLORIDE 0.9% FLUSH: 3.0000 mL | Freq: Two times a day (BID) | INTRAVENOUS | Status: DC

## 2015-11-12 MED ORDER — HEPARIN (PORCINE) IN NACL 2-0.9 UNIT/ML-% IJ SOLN
INTRAMUSCULAR | Status: DC | PRN
  Administered 2015-11-12: 1500 mL

## 2015-11-12 MED ORDER — ISOSORBIDE MONONITRATE ER 30 MG PO TB24: 30.0000 mg | ORAL_TABLET | Freq: Every day | ORAL | Status: AC

## 2015-11-12 MED ORDER — SODIUM CHLORIDE 0.9 % WEIGHT BASED INFUSION
3.0000 mL/kg/h | INTRAVENOUS | Status: DC
  Administered 2015-11-12: 3 mL/kg/h via INTRAVENOUS

## 2015-11-12 MED ORDER — SODIUM CHLORIDE 0.9 % IV SOLN: INTRAVENOUS | Status: AC

## 2015-11-12 MED ORDER — LIDOCAINE HCL (PF) 1 % IJ SOLN
INTRAMUSCULAR | Status: AC
  Filled 2015-11-12: qty 30

## 2015-11-12 MED ORDER — ONDANSETRON HCL 4 MG/2ML IJ SOLN
INTRAMUSCULAR | Status: AC
  Filled 2015-11-12: qty 2

## 2015-11-12 MED ORDER — HYDRALAZINE HCL 20 MG/ML IJ SOLN
INTRAMUSCULAR | Status: DC | PRN
  Administered 2015-11-12: 5 mg via INTRAVENOUS

## 2015-11-12 MED ORDER — IOPAMIDOL (ISOVUE-370) INJECTION 76%
INTRAVENOUS | Status: DC | PRN
  Administered 2015-11-12: 150 mL

## 2015-11-12 MED ORDER — AMLODIPINE BESYLATE 10 MG PO TABS: 10.0000 mg | ORAL_TABLET | Freq: Every day | ORAL | Status: DC

## 2015-11-12 MED ORDER — ASPIRIN 81 MG PO CHEW
CHEWABLE_TABLET | ORAL | Status: AC
  Administered 2015-11-12: 81 mg via ORAL
  Filled 2015-11-12: qty 1

## 2015-11-12 MED ORDER — ONDANSETRON HCL 4 MG/2ML IJ SOLN
4.0000 mg | Freq: Four times a day (QID) | INTRAMUSCULAR | Status: DC | PRN
  Administered 2015-11-12: 4 mg via INTRAVENOUS

## 2015-11-12 MED ORDER — AMLODIPINE BESYLATE 10 MG PO TABS
10.0000 mg | ORAL_TABLET | Freq: Every day | ORAL | Status: DC
  Administered 2015-11-13: 10 mg via ORAL
  Filled 2015-11-12: qty 1

## 2015-11-12 MED ORDER — ASPIRIN 81 MG PO CHEW
81.0000 mg | CHEWABLE_TABLET | ORAL | Status: AC
  Administered 2015-11-12: 81 mg via ORAL

## 2015-11-12 MED ORDER — MORPHINE SULFATE (PF) 2 MG/ML IV SOLN
2.0000 mg | Freq: Once | INTRAVENOUS | Status: AC
  Administered 2015-11-12: 2 mg via INTRAVENOUS

## 2015-11-12 MED ORDER — MIDAZOLAM HCL 2 MG/2ML IJ SOLN
INTRAMUSCULAR | Status: DC | PRN
  Administered 2015-11-12: 1 mg via INTRAVENOUS
  Administered 2015-11-12: 2 mg via INTRAVENOUS

## 2015-11-12 MED ORDER — AMLODIPINE BESYLATE 5 MG PO TABS
5.0000 mg | ORAL_TABLET | Freq: Every day | ORAL | Status: DC
  Administered 2015-11-12: 5 mg via ORAL
  Filled 2015-11-12: qty 1

## 2015-11-12 MED ORDER — SODIUM CHLORIDE 0.9 % WEIGHT BASED INFUSION: 1.0000 mL/kg/h | INTRAVENOUS | Status: DC

## 2015-11-12 MED ORDER — RIVAROXABAN 20 MG PO TABS: 20.0000 mg | ORAL_TABLET | Freq: Every day | ORAL | Status: AC

## 2015-11-12 MED ORDER — IOPAMIDOL (ISOVUE-370) INJECTION 76%
INTRAVENOUS | Status: AC
  Filled 2015-11-12: qty 100

## 2015-11-12 MED ORDER — ONDANSETRON HCL 4 MG/2ML IJ SOLN: 4.0000 mg | Freq: Four times a day (QID) | INTRAMUSCULAR | Status: DC | PRN

## 2015-11-12 SURGICAL SUPPLY — 18 items
CATH INFINITI 5 FR AL2 (CATHETERS) ×4 IMPLANT
CATH INFINITI 5 FR AR2 MOD (CATHETERS) ×4 IMPLANT
CATH INFINITI 5 FR JL6.0 (CATHETERS) ×4 IMPLANT
CATH INFINITI 5FR AL3 (CATHETERS) ×4 IMPLANT
CATH INFINITI 5FR MULTPACK ANG (CATHETERS) ×4 IMPLANT
CATH LAUNCHER 5F 3DRIGHT (CATHETERS) ×2 IMPLANT
CATH SITESEER 5F NTR (CATHETERS) ×4 IMPLANT
CATH SWAN GANZ 7F STRAIGHT (CATHETERS) ×4 IMPLANT
CATHETER LAUNCHER 5F 3DRIGHT (CATHETERS) ×4
KIT HEART LEFT (KITS) ×4 IMPLANT
KIT HEART RIGHT NAMIC (KITS) ×4 IMPLANT
PACK CARDIAC CATHETERIZATION (CUSTOM PROCEDURE TRAY) ×4 IMPLANT
SHEATH PINNACLE 5F 10CM (SHEATH) ×4 IMPLANT
SHEATH PINNACLE 7F 10CM (SHEATH) ×4 IMPLANT
SYR MEDRAD MARK V 150ML (SYRINGE) ×4 IMPLANT
TRANSDUCER W/STOPCOCK (MISCELLANEOUS) ×8 IMPLANT
WIRE EMERALD 3MM-J .035X150CM (WIRE) ×4 IMPLANT
WIRE SAFE-T 1.5MM-J .035X260CM (WIRE) IMPLANT

## 2015-11-12 NOTE — Progress Notes (Signed)
Garen LahLaura Ingold,PA in to see client and orders noted EKG done and morphine given IV

## 2015-11-12 NOTE — Progress Notes (Signed)
Nausea and vomiting large amt partially digested food

## 2015-11-12 NOTE — H&P (View-Only) (Signed)
Patient ID: Sean BoatmanDanny L Hardy, male   DOB: 10/14/1950, 65 y.o.   MRN: [id number]     Clinical Summary Sean Hardy is a 64 y.o.male last seen by Dr Dietrich Patesothbart, this is our first visit together. He is seen for the following medical problems. He was most recently seen by Dr Molly Madurolevenger at [facility] a few months ago.   1. CAD - fairly unclear history from available records, appears he had coroanry stents done at [facility] several years ago. It looks like he had a PCI to his LAD in 2003.   - recent admit [facility] with atypical chest pain, reproducible to palpation according to notes. Denies any recurrent symptoms    2. Aflutter - reports occasional palpitations, overall infrequent. No bleeding troubles on xarelto. Has been on amio and lopressor.   3. HTN - compliant with meds  4. History of cardiomyopathy - 10/25/2010 echo LVEF 15% - 11/15/10 LVEF 55-60% - Echocardiogram December 2014 and ejection fraction 55-60%, dilated aortic root (CT scan below), moderate lae,1+ MR, G1DD   - denies any SOB, DOE, LE edema  5. Thoracic aneurysm - followed by CT surgery, being considered for repair.   Past Medical History  Diagnosis Date  . Arteriosclerotic cardiovascular disease (ASCVD) 2006    St[facility] to unknown arteries [facility] ; echo in 2006-normal EF, moderate LVH, mild inferior hypokinesis  . Hypertension   . 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 Hypothyroidism   . Atrial flutter (HCC) 2012    Left atrial appendage thrombus with SEC; converted on amiodarone to marked SB  . Cardiomyopathy 2012    CHF in 10/2010; possibly tachycardia mediated; EF of 20-25%  . Chronic anticoagulation 2012    Discontinued  . Tobacco abuse     40 pack years; Tapering use; 10/2012:1/5 pack per day  . Gout   . Benign prostatic hypertrophy   . Nephrolithiasis     Lithotripsy; left ureteral calculus in 10/2012  . Stroke (HCC)   . Gout   . Anxiety   .  Major depressive disorder (HCC)   . CHF (congestive heart failure) (HCC)   . Hyperlipidemia   . Peripheral vascular disease (HCC)   . Atrial fibrillation (HCC)   . GERD (gastroesophageal reflux disease)   . Muscle weakness   . Aortic aneurysm, thoracic (HCC)      Allergies  Allergen Reactions  . Penicillins Other (See Comments)    Unknown. Has patient had a PCN reaction causing immediate rash, facial/tongue/throat swelling, SOB or lightheadedness with hypotension: No Has patient had a PCN reaction causing severe rash involving mucus membranes or skin necrosis: No Has patient had a PCN reaction that required hospitalization No Has patient had a PCN reaction occurring within the last 10 years: No If all of the above answers are "NO", then may proceed with Cephalosporin use.      Current Outpatient Prescriptions  Medication Sig Dispense Refill  . allopurinol (ZYLOPRIM) 100 MG tablet Take 100 mg by mouth daily.    Marland Kitchen. amiodarone (PACERONE) 200 MG tablet TAKE ONE TABLET BY MOUTH EVERY DAY 30 tablet 6  . amLODipine (NORVASC) 5 MG tablet Take 1 tablet (5 mg total) by mouth daily. 30 tablet 0  . atorvastatin (LIPITOR) 40 MG tablet Take 40 mg by mouth daily.      . Cholecalciferol 5000 UNITS TABS Take 1 tablet by mouth every 30 (thirty) days.    .Marland Kitchen  furosemide (LASIX) 40 MG tablet Take 40 mg by mouth daily.    Marland Kitchen levothyroxine (SYNTHROID, LEVOTHROID) 175 MCG tablet Take 175 mcg by mouth daily before breakfast.    . lisinopril (PRINIVIL,ZESTRIL) 5 MG tablet Take 5 mg by mouth daily.    . magnesium hydroxide (MILK OF MAGNESIA) 400 MG/5ML suspension Take 30 mLs by mouth every 2 (two) hours as needed for indigestion.     . metoprolol (LOPRESSOR) 50 MG tablet Take 50 mg by mouth 2 (two) times daily.    . nitroGLYCERIN (NITROSTAT) 0.4 MG SL tablet Place 0.4 mg under the tongue every 5 (five) minutes as needed.      . Olopatadine HCl 0.2 % SOLN Apply 1 drop to eye.    . Omega-3 Fatty Acids (SUPER  OMEGA-EPA) 1000 MG CAPS Take 2 capsules by mouth 2 (two) times daily.     Marland Kitchen omeprazole (PRILOSEC) 20 MG capsule Take 20 mg by mouth daily.    Marland Kitchen oxyCODONE-acetaminophen (ROXICET) 5-325 MG tablet Take 1-2 tablets by mouth every 4 (four) hours as needed for severe pain. 30 tablet 0  . polyethylene glycol (MIRALAX / GLYCOLAX) packet Take 17 g by mouth every other day.    . rivaroxaban (XARELTO) 20 MG TABS tablet Take 20 mg by mouth daily with supper.    . senna (SENOKOT) 8.6 MG tablet Take 1 tablet by mouth 2 (two) times daily.    . solifenacin (VESICARE) 5 MG tablet Take 5 mg by mouth daily.     . tamsulosin (FLOMAX) 0.4 MG CAPS Take by mouth daily.    . traMADol (ULTRAM) 50 MG tablet Take 50 mg by mouth every 8 (eight) hours.     No current facility-administered medications for this visit.     Past Surgical History  Procedure Laterality Date  . Lithotripsy    . Cardioversion    . Coronary stent placement  2003     Allergies  Allergen Reactions  . Penicillins Other (See Comments)    Unknown. Has patient had a PCN reaction causing immediate rash, facial/tongue/throat swelling, SOB or lightheadedness with hypotension: No Has patient had a PCN reaction causing severe rash involving mucus membranes or skin necrosis: No Has patient had a PCN reaction that required hospitalization No Has patient had a PCN reaction occurring within the last 10 years: No If all of the above answers are "NO", then may proceed with Cephalosporin use.       Family History  Problem Relation Age of Onset  . Dementia Mother   . Dementia Father      Social History Sean Hardy reports that he quit smoking about 2 years ago. His smoking use included Cigarettes. He has a 20 pack-year smoking history. He has never used smokeless tobacco. Sean Hardy reports that he does not drink alcohol.   Review of Systems CONSTITUTIONAL: No weight loss, fever, chills, weakness or fatigue.  HEENT: Eyes: No visual loss,  blurred vision, double vision or yellow sclerae.No hearing loss, sneezing, congestion, runny nose or sore throat.  SKIN: No rash or itching.  CARDIOVASCULAR: per HPI RESPIRATORY: No shortness of breath, cough or sputum.  GASTROINTESTINAL: No anorexia, nausea, vomiting or diarrhea. No abdominal pain or blood.  GENITOURINARY: No burning on urination, no polyuria NEUROLOGICAL: No headache, dizziness, syncope, paralysis, ataxia, numbness or tingling in the extremities. No change in bowel or bladder control.  MUSCULOSKELETAL: No muscle, back pain, joint pain or stiffness.  LYMPHATICS: No enlarged nodes. No history of splenectomy.  PSYCHIATRIC: No history of depression or anxiety.  ENDOCRINOLOGIC: No reports of sweating, cold or heat intolerance. No polyuria or polydipsia.  Marland Kitchen   Physical Examination Filed Vitals:   10/18/15 1514  BP: 139/85  Pulse: 50   Filed Vitals:   10/18/15 1514  Height:  (1.854 m)  Weight: 250 lb (113.399 kg)    Gen: resting comfortably, no acute distress HEENT: no scleral icterus, pupils equal round and reactive, no palptable cervical adenopathy,  CV:: RRR, no m/r/g,n o jvd Resp: Clear to auscultation bilaterally GI: abdomen is soft, non-tender, non-distended, normal bowel sounds, no hepatosplenomegaly MSK: extremities are warm, no edema.  Skin: warm, no rash Neuro:  no focal deficits Psych: appropriate affect      Assessment and Plan   1. Thoracic aortic aneurysm - being considered for repair. We will obtain echo and LHC/RHC as part of preopeartive workup. Await echo results first, then arrange LHC/RHC   2. CAD - no current symptoms, conintue current meds  3. Aflutter - no significant symptoms, continue current meds.  4. HTN  - at goal, continue current meds.    F/u 4 months  Antoine Poche, M.D.

## 2015-11-12 NOTE — Interval H&P Note (Signed)
History and Physical Interval Note:  11/12/2015 9:25 AM  Sean Hardy  has presented today for surgery, with the diagnosis of cp  The various methods of treatment have been discussed with the patient and family. After consideration of risks, benefits and other options for treatment, the patient has consented to  Procedure(s): Right/Left Heart Cath and Coronary Angiography (N/A) as a surgical intervention .  The patient's history has been reviewed, patient examined, no change in status, stable for surgery.  I have reviewed the patient's chart and labs.  Questions were answered to the patient's satisfaction.     Nicki Guadalajarahomas Kanav Kazmierczak

## 2015-11-12 NOTE — Progress Notes (Signed)
Called to see pt for chest pain.  EKG without acute changes except occ PVC.  Pt sharp middle of his chest.  He had atypical pains per Dr. Verna CzechBranch's note.  Discussed with Dr. Tresa EndoKelly and we increased his amlodipine to 10 mg and added imdur.  If pain resolves he may go home and follow up with Dr. Laneta SimmersBartle.  We did give 2 mg with improvement.

## 2015-11-12 NOTE — Progress Notes (Signed)
Report called to RN for room 2-W-38 and transferred via wheelchair

## 2015-11-12 NOTE — Progress Notes (Signed)
Site area: Right groin a 5 french arteria and 7 frnch venous sheath was removed  Site Prior to Removal:  Level 0  Pressure Applied For 20 MINUTES    Bedrest Beginning at 1115a  Manual:   Yes.    Patient Status During Pull:  stable  Post Pull Groin Site:  Level 0  Post Pull Instructions Given:  Yes.    Post Pull Pulses Present:  Yes.    Dressing Applied:  Yes.    Comments:  VS remain stable during sheath pull except for BP 182/103 Medication given per Dr Tresa EndoKelly order.

## 2015-11-13 ENCOUNTER — Encounter (HOSPITAL_COMMUNITY): Payer: Self-pay | Admitting: Cardiovascular Disease

## 2015-11-13 DIAGNOSIS — I719 Aortic aneurysm of unspecified site, without rupture: Secondary | ICD-10-CM | POA: Diagnosis not present

## 2015-11-13 DIAGNOSIS — I4892 Unspecified atrial flutter: Secondary | ICD-10-CM | POA: Diagnosis not present

## 2015-11-13 DIAGNOSIS — I712 Thoracic aortic aneurysm, without rupture: Secondary | ICD-10-CM | POA: Diagnosis not present

## 2015-11-13 LAB — BASIC METABOLIC PANEL
ANION GAP: 8 (ref 5–15)
BUN: 12 mg/dL (ref 6–20)
CHLORIDE: 109 mmol/L (ref 101–111)
CO2: 22 mmol/L (ref 22–32)
Calcium: 8.9 mg/dL (ref 8.9–10.3)
Creatinine, Ser: 1.19 mg/dL (ref 0.61–1.24)
GFR calc Af Amer: >60 mL/min (ref >60)
GLUCOSE: 102 mg/dL — AB (ref 65–99)
POTASSIUM: 3.7 mmol/L (ref 3.5–5.1)
Sodium: 139 mmol/L (ref 135–145)

## 2015-11-13 LAB — CBC
HEMATOCRIT: 37.8 % — AB (ref 39.0–52.0)
HEMOGLOBIN: 12.6 g/dL — AB (ref 13.0–17.0)
MCH: 32.3 pg (ref 26.0–34.0)
MCHC: 33.3 g/dL (ref 30.0–36.0)
MCV: 96.9 fL (ref 78.0–100.0)
Platelets: 180 10*3/uL (ref 150–400)
RBC: 3.9 MIL/uL — ABNORMAL LOW (ref 4.22–5.81)
RDW: 13.9 % (ref 11.5–15.5)
WBC: 6.8 10*3/uL (ref 4.0–10.5)

## 2015-11-13 NOTE — Discharge Instructions (Signed)
Angiogram, Care After Refer to this sheet in the next few weeks. These instructions provide you with information about caring for yourself after your procedure. Your health care provider may also give you more specific instructions. Your treatment has been planned according to current medical practices, but problems sometimes occur. Call your health care provider if you have any problems or questions after your procedure. WHAT TO EXPECT AFTER THE PROCEDURE After your procedure, it is typical to have the following:  Bruising at the catheter insertion site that usually fades within 1-2 weeks.  Blood collecting in the tissue (hematoma) that may be painful to the touch. It should usually decrease in size and tenderness within 1-2 weeks. HOME CARE INSTRUCTIONS  Take medicines only as directed by your health care provider.  You may shower 24-48 hours after the procedure or as directed by your health care provider. Remove the bandage (dressing) and gently wash the site with plain soap and water. Pat the area dry with a clean towel. Do not rub the site, because this may cause bleeding.  Do not take baths, swim, or use a hot tub until your health care provider approves.  Check your insertion site every day for redness, swelling, or drainage.  Do not apply powder or lotion to the site.  Do not lift over 10 lb (4.5 kg) for 5 days after your procedure or as directed by your health care provider.  Ask your health care provider when it is okay to:  Return to work or school.  Resume usual physical activities or sports.  Resume sexual activity.  Do not drive home if you are discharged the same day as the procedure. Have someone else drive you.  You may drive 24 hours after the procedure unless otherwise instructed by your health care provider.  Do not operate machinery or power tools for 24 hours after the procedure or as directed by your health care provider.  If your procedure was done as an  outpatient procedure, which means that you went home the same day as your procedure, a responsible adult should be with you for the first 24 hours after you arrive home.  Keep all follow-up visits as directed by your health care provider. This is important. SEEK MEDICAL CARE IF:  You have a fever.  You have chills.  You have increased bleeding from the catheter insertion site. Hold pressure on the site. SEEK IMMEDIATE MEDICAL CARE IF:  You have unusual pain at the catheter insertion site.  You have redness, warmth, or swelling at the catheter insertion site.  You have drainage (other than a small amount of blood on the dressing) from the catheter insertion site.  The catheter insertion site is bleeding, and the bleeding does not stop after 30 minutes of holding steady pressure on the site.  The area near or just beyond the catheter insertion site becomes pale, cool, tingly, or numb.   This information is not intended to replace advice given to you by your health care provider. Make sure you discuss any questions you have with your health care provider.   Document Released: 11/28/2004 Document Revised: 06/02/2014 Document Reviewed: 10/13/2012 Elsevier Interactive Patient Education 2016 [employer]    RESUME BendXARELTO Tonight 6/20

## 2015-11-13 NOTE — NC FL2 (Signed)
[place] MEDICAID FL2 LEVEL OF CARE SCREENING TOOL     IDENTIFICATION  Patient Name: Sean Hardy Birthdate: 07/18/1950 Sex: male Admission Date (Current Location): 11/12/2015  [facility] [place]   Facility and Address:  [facility] [place], [address], [place], [place] [address]      Provider Number: [id number]  Attending Physician Name and Address:  Lennette Biharihomas A Kelly, MD  Relative Name and Phone Number:       Current Level of Care: Hospital Recommended Level of Care: Nursing Facility Prior Approval Number:    Date Approved/Denied:   PASRR Number: [id number]  Discharge Plan: SNF    Current Diagnoses: Patient Active Problem List   Diagnosis Date Noted  . CAD in native artery   . Aneurysm of aortic root (HCC)   . Chest pain 10/15/2015  . Aneurysm of thoracic aorta (HCC) 10/15/2015  . 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 Aortic root aneurysm (HCC) 10/14/2015  . CKD (chronic kidney disease) stage 3, GFR 30-59 ml/min 11/05/2012  . Nephrolithiasis   . Atrial flutter (HCC)   . Tachycardia induced cardiomyopathy (HCC)   . Gout   . History of CVA (cerebrovascular accident)   . Hypothyroidism   . 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 Hypertension 11/15/2010  . Tobacco abuse 11/15/2010  . Arteriosclerotic cardiovascular disease (ASCVD) 11/15/2010    Orientation RESPIRATION BLADDER Height & Weight     Self, Time, Situation, Place  Normal Continent Weight: 250 lb (113.399 kg) Height:  6\' 1"  (185.4 cm)  BEHAVIORAL SYMPTOMS/MOOD NEUROLOGICAL BOWEL NUTRITION STATUS   (None)  (None) Continent  (Heart )  AMBULATORY STATUS COMMUNICATION OF NEEDS Skin   Extensive Assist Verbally Normal                       Personal Care Assistance Level of Assistance  Bathing, Feeding, Dressing Bathing Assistance: Limited assistance Feeding assistance: Independent Dressing Assistance: Limited assistance     Functional Limitations  Info  Sight, Hearing, Speech Sight Info: Adequate Hearing Info: Adequate Speech Info: Adequate    SPECIAL CARE FACTORS FREQUENCY  PT (By licensed PT), OT (By licensed OT)     PT Frequency: 5/ week OT Frequency: 5/ week            Contractures Contractures Info: Not present    Additional Factors Info  Code Status, Allergies Code Status Info: Full Allergies Info: Penicillins           Current Medications (11/13/2015):  This is the current hospital active medication list Current Facility-Administered Medications  Medication Dose Route Frequency Provider Last Rate Last Dose  . 0.9 %  sodium chloride infusion  250 mL Intravenous PRN Lennette Biharihomas A Kelly, MD      . 0.9 %  sodium chloride infusion  250 mL Intravenous PRN Leone BrandLaura R Ingold, NP      . acetaminophen (TYLENOL) tablet 650 mg  650 mg Oral Q4H PRN Lennette Biharihomas A Kelly, MD      . amLODipine (NORVASC) tablet 10 mg  10 mg Oral Daily Leone BrandLaura R Ingold, NP   10 mg at 11/13/15 0956  . aspirin EC tablet 81 mg  81 mg Oral Daily Lennette Biharihomas A Kelly, MD   81 mg at 11/13/15 0956  . heparin injection 5,000 Units  5,000 Units Subcutaneous Q8H Leone BrandLaura R Ingold, NP   5,000 Units at 11/13/15 [phone]  . isosorbide mononitrate (IMDUR) 24 hr tablet 30 mg  30 mg Oral Daily Leone Brand, NP   30 mg at 11/13/15 1610  . ondansetron (ZOFRAN) injection 4 mg  4 mg Intravenous Q6H PRN Lennette Bihari, MD   4 mg at 11/12/15 1603  . ondansetron (ZOFRAN) tablet 4 mg  4 mg Oral Q6H PRN Leone Brand, NP       Or  . ondansetron [facility]) injection 4 mg  4 mg Intravenous Q6H PRN Leone Brand, NP      . sodium chloride flush (NS) 0.9 % injection 3 mL  3 mL Intravenous Q12H Lennette Bihari, MD      . sodium chloride flush (NS) 0.9 % injection 3 mL  3 mL Intravenous PRN Lennette Bihari, MD      . sodium chloride flush (NS) 0.9 % injection 3 mL  3 mL Intravenous Q12H Leone Brand, NP   3 mL at 11/12/15 2131  . sodium chloride flush (NS) 0.9 % injection 3 mL  3 mL Intravenous Q12H  Leone Brand, NP   3 mL at 11/13/15 0955  . sodium chloride flush (NS) 0.9 % injection 3 mL  3 mL Intravenous PRN Leone Brand, NP         Discharge Medications: Please see discharge summary for a list of discharge medications.  Relevant Imaging Results:  Relevant Lab Results:   Additional Information SSN:[id number]  [facility], LCSW

## 2015-11-13 NOTE — Progress Notes (Signed)
Patient Name: Sean Hardy Date of Encounter: 11/13/2015  Primary Cardiologist: Dr. Wyline Mood   Principal Problem:   Aneurysm of aortic root (HCC) Active Problems:   Hypertension   Tobacco abuse   History of CVA (cerebrovascular accident)   Hypothyroidism   Atrial flutter (HCC)   CKD (chronic kidney disease) stage 3, GFR 30-59 ml/min   CAD in native artery    SUBJECTIVE  Denies any CP or SOB.   CURRENT MEDS . amLODipine  10 mg Oral Daily  . aspirin EC  81 mg Oral Daily  . heparin  5,000 Units Subcutaneous Q8H  . isosorbide mononitrate  30 mg Oral Daily  . sodium chloride flush  3 mL Intravenous Q12H  . sodium chloride flush  3 mL Intravenous Q12H  . sodium chloride flush  3 mL Intravenous Q12H    OBJECTIVE  Filed Vitals:   11/12/15 1545 11/12/15 1710 11/12/15 2014 11/13/15 0439  BP: 158/95 156/79 121/83 133/66  Pulse: 71 78 71 66  Temp:  98.2 F (36.8 C) 97.7 F (36.5 C) 98.1 F (36.7 C)  TempSrc:  Oral Oral Oral  Resp:  Height:      Weight:      SpO2: 99% 98% 97% 96%    Intake/Output Summary (Last 24 hours) at 11/13/15 1012 Last data filed at 11/13/15 0800  Gross per 24 hour  Intake    240 ml  Output   1050 ml  Net   -810 ml   Filed Weights   11/12/15 0733  Weight: 250 lb (113.399 kg)    PHYSICAL EXAM  General: Pleasant, NAD. Neuro: Alert and oriented X 3. Moves all extremities spontaneously. Psych: Normal affect. HEENT:  Normal  Neck: Supple without bruits or JVD. Lungs:  Resp regular and unlabored, CTA. Heart: RRR no s3, s4, or murmurs. Abdomen: Soft, non-tender, non-distended, BS + x 4.  Extremities: No clubbing, cyanosis or edema. DP/PT/Radials 2+ and equal bilaterally.  Accessory Clinical Findings  CBC  Recent Labs  11/12/15 0730 11/13/15 0255  WBC 4.9 6.8  HGB 12.9* 12.6*  HCT 38.4* 37.8*  MCV 94.6 96.9  PLT 169 180   Basic Metabolic Panel  Recent Labs  11/12/15 0730 11/13/15 0255  NA 138 139  K 3.6 3.7  CL  108 109  CO2 25 22  GLUCOSE 105* 102*  BUN 11 12  CREATININE 1.21 1.19  CALCIUM 8.9 8.9    TELE NSR without significant ventricular ectopy    ECG  No new EKG  Echocardiogram 10/31/2015  LV EF: 50% - 55%  ------------------------------------------------------------------- History: PMH: Atrial flutter. Coronary artery disease. Cardiomyopathy of unknown etiology. Stroke. Risk factors: Former tobacco use. Hypertension. Obese.  ------------------------------------------------------------------- Study Conclusions  - Left ventricle: The cavity size was normal. Krone thickness was  increased in a pattern of moderate LVH. Systolic function was  normal. The estimated ejection fraction was in the range of 50%  to 55%. Diffuse hypokinesis. Left ventricular diastolic function  parameters were normal for the patient&'s age. - Aortic valve: There was trivial regurgitation. - Aorta: Aortic root dimension: 51 mm (ED). - Aortic root: The aortic root was moderately dilated. - Mitral valve: There was trivial regurgitation. - Left atrium: The atrium was moderately to severely dilated. - Right atrium: Central venous pressure (est): 3 mm Hg. - Tricuspid valve: There was trivial regurgitation. - Pulmonary arteries: Systolic pressure could not be accurately  estimated. - Pericardium, extracardiac: There was no pericardial effusion.  Impressions:  - Moderate LVH with LVEF 50-55%, grossly normal diastolic function.  Moderate to severe left atrial enlargement. Trivial mitral  regurgitation. Moderately dilated aortic root measuring 51 mm on  this particular study. Trivial tricuspid regurgitation.    Radiology/Studies  Dg Chest Portable 1 View  10/15/2015  CLINICAL DATA:  Chest pain since last night. History of thoracic aneurysm, hypertension and congestive heart failure. EXAM: PORTABLE CHEST 1 VIEW COMPARISON:  CT 08/30/2015.  Radiographs 07/02/2014. FINDINGS: 1245  hours. The heart size and mediastinal contours are stable. The heart is mildly enlarged. The lungs are clear. There is no pleural effusion or pneumothorax. The bones appear unremarkable. Telemetry leads overlie the chest. IMPRESSION: Stable chest.  No acute cardiopulmonary process. Electronically Signed   By: Carey BullocksWilliam  Veazey M.D.   On: 10/15/2015 12:59   Ct Angio Chest/abd/pel For Dissection W And/or W/wo  10/15/2015  CLINICAL DATA:  Recurrent chest pain since last night, temporarily relieved by nitroglycerin. History of thoracic aortic aneurysm. EXAM: CT ANGIOGRAPHY CHEST, ABDOMEN AND PELVIS TECHNIQUE: Multidetector CT imaging through the chest, abdomen and pelvis was performed using the standard protocol during bolus administration of intravenous contrast. Multiplanar reconstructed images and MIPs were obtained and reviewed to evaluate the vascular anatomy. CONTRAST:  100 ml Isovue-370. COMPARISON:  CT abdomen 02/12/2015.  Chest CTA 08/30/2015. FINDINGS: CTA CHEST FINDINGS Mediastinum: Pre contrast images demonstrate diffuse atherosclerosis, most advanced within the coronary arteries. There are no displaced intimal calcifications within the thoracic aorta. Postcontrast, the aortic lumen opacifies normally. There is no evidence of dissection. There is persistent dilatation of the aortic root, measuring up to 5.9 x 5.5 cm on image 83. The pulmonary arteries are suboptimally opacified with contrast. There is no evidence of acute pulmonary embolism. The heart size is stable. There is no pericardial effusion. There are no enlarged mediastinal, hilar or axillary lymph nodes. The thyroid gland, trachea and esophagus demonstrate no significant findings. Lungs/Pleura: There is no pleural effusion.The lungs appear stable with mild biapical scarring and bibasilar atelectasis. There is no confluent airspace opacity or suspicious pulmonary nodule. Musculoskeletal/Chest [place]: No chest Berne lesion or acute osseous findings.  Review of the MIP images confirms the above findings. CTA ABDOMEN AND PELVIS FINDINGS Hepatobiliary: The hepatic density is diffusely decreased consistent with steatosis. There is relative sparing with around the gallbladder. No focal hepatic lesions are identified. No evidence of gallstones, gallbladder Vullo thickening or biliary dilatation. Pancreas: Unremarkable. No pancreatic ductal dilatation or surrounding inflammatory changes. Spleen: Normal in size without focal abnormality. Adrenals/Urinary Tract: Both adrenal glands appear normal. Both kidneys demonstrate cortical scarring and small cysts. There is no evidence of renal mass, urinary tract calculus or hydronephrosis. Stomach/Bowel: No evidence of bowel T[place]ley thickening, distention or surrounding inflammatory change. Vascular/Lymphatic: There is diffuse atherosclerosis of the aorta, its branches and the iliac arteries. 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 There is no evidence of large vessel occlusion, aneurysm or dissection. There are no enlarged abdominal pelvic lymph nodes. Reproductive: The prostate gland and seminal vesicles appear unremarkable. Other: Small umbilical hernia containing only fat. There are postsurgical changes in the anterior abdominal [place]. There is dependent edema within the subcutaneous fat of the back. Musculoskeletal: No acute or significant osseous findings. Review of the MIP images confirms the above findings. IMPRESSION: 1. Stable diffuse aortic and branch vessel atherosclerosis. No evidence of aortic dissection or other acute vascular findings. 2. Stable dilatation of the aortic root compared with recent chest CTA of 6 weeks ago. 3. Hepatic steatosis,  renal cortical thinning and probable small renal cysts. Electronically Signed   By: Carey Bullocks M.D.   On: 10/15/2015 17:10    ASSESSMENT AND PLAN  65 yo male with PMH of thoracic aneurysm and CAD came in for preop clearance L&RHC prior to his aneurysm repair.   1.  CAD  - Unclear past history, appears he had coroanry stents done at [facility] several years ago, also PCI to his LAD in 2003  - Echo 10/31/2015 EF 50-55%, aortic root dimension 51mm, moderately to severely dilated LA  - slow to answer question this morning, unclear baseline, when asked about h/o stroke, he says he had 1 in 2006. When asked about weakness, he complains of L side weakness but when questioned about onset, he initially says he walk with a cane and then later says he notice it more after cath. Will discuss with MD  - he is a poor historian, he asked about a "hole" in the heart, I could not find any record about "hole" in the heart, last echo did not show PFO. Although it does mention mild MR.   2. Paroxysmal Aflutter on Xarelto  - for some reason, none of his home med has been ordered. He says he does take metoprolol and lisinopril, but he is not sure if he is taking amiodarone.   3. HTN  4. History of cardiomyopathy  - 10/25/2010 echo LVEF 15%  - 11/15/10 LVEF 55-60%  - Echocardiogram December 2014 and ejection fraction 55-60%, dilated aortic root (CT scan below), moderate lae,1+ MR, G1DD   5. Thoracic aneurysm  - followed by CT surgery, considered for repair.   Signed, Azalee Course PA-C Pager: [phone]  Personally seen and examined. Agree with above.  Prox RCA 90%, aortic root 58mm.  Restart Xarelto Amio 200 QD at home previously on med lists.  Left arm weakness is chronic since stroke several years ago he tells me. No change since cath.   OK with DC. Close follow up with Dr. Laneta Simmers.   Donato Schultz, MD

## 2015-11-13 NOTE — Clinical Social Work Note (Signed)
[facility] CSW spoke with [facility] and she reported facility will provide transportation for patient to return. CSW signing off.   Shonny Anaja Monts, LCSW [phone].

## 2015-11-13 NOTE — Progress Notes (Signed)
Notified Dr. Anne FuSkains regarding d/c summary. Will continue to monitor. Sean Hardy RCharity fundraiser

## 2015-11-13 NOTE — Clinical Social Work Note (Signed)
CSW spoke with patient's legal guardian Barnie AldermanMelissa Price. She reported [facility]. [facility]. She reported patient can return to facility, but will need discharge summary by 3:30PM. CSW continuing to follow for discharge needs.  Valero EnergyShonny Gennette Shadix, LCSW [phone]

## 2015-11-13 NOTE — Discharge Summary (Signed)
Discharge Summary    Patient ID: Sean Hardy,  MRN: [id number], DOB/AGE: 1950/10/02 65 y.o.  Admit date: 11/12/2015 Discharge date: 11/13/2015  Primary Care Provider: [facility] Primary Cardiologist: Dr. Wyline Mood  Discharge Diagnoses    Principal Problem:   Aneurysm of aortic root (HCC) Active Problems:   Hypertension   Tobacco abuse   History of CVA (cerebrovascular accident)   Hypothyroidism   Atrial flutter (HCC)   CKD (chronic kidney disease) stage 3, GFR 30-59 ml/min   CAD in native artery   Allergies Allergies  Allergen Reactions  . Penicillins Other (See Comments)    Unknown. Has patient had a PCN reaction causing immediate rash, facial/tongue/throat swelling, SOB or lightheadedness with hypotension: No Has patient had a PCN reaction causing severe rash involving mucus membranes or skin necrosis: No Has patient had a PCN reaction that required hospitalization No Has patient had a PCN reaction occurring within the last 10 years: No If all of the above answers are "NO", then may proceed with Cephalosporin use.     Diagnostic Studies/Procedures    Cath 11/12/2015 Conclusion     Mid LAD lesion, 30% stenosed. The lesion was previously treated with a stent (unknown type).  Prox Cx to Mid Cx lesion, 40% stenosed.  Ost RCA lesion, 30% stenosed.  Prox RCA lesion, 90% stenosed.  Mid RCA lesion, 50% stenosed.  There is mild to moderate left ventricular systolic dysfunction.  Markedly dilated thoracic aortic root and ascending aorta.  Trivial aortic insufficiency.  Mild pulmonary hypertension with mild elevation of right-sided heart pressures.  Mild - moderate LV dysfunction with diffuse 40-45% ejection fraction with more pronounced hypocontractility in the basal inferior [place]  Coronary calcification of the LAD with 30% narrowing in the proximal portion of the previously placed stent arising after the takeoff of the first diagonal in the region of the  first septal perforating artery.  Moderate size left circumflex coronary artery with 40% proximal stenosis.  Calcified RCA with 30% ostial stenosis, diffuse 90% proximal stenosis and 50% mid stenosis.  RECOMMENDATION: The patient will be referred back to Dr. Evelene Croon for surgery of his ascending aortic aneurysm and need for CABG to his RCA.     _____________   History of Present Illness     65 yo male with PMH of CVA, Aflutter on Xarelto, HTN, h/o cardiomyopathy with improved EF, CAD and known ascending thoracic aneurysm come in for planned L and R cath as preop clearance for thoracic aneurysm repair. He has remote stent in 2003 and possible PCI at [facility] several years ago. He had a 5.8 cm aortic aneurysm noted on CT of chest obtained at [facility]. He has been seen by Dr. Laneta Simmers on 10/10/2015 for surgical evaluation. His last echo on 10/31/2015 showed EF 50-55%, aortic root dimension 51mm, moderately to severely dilated LA.  Hospital Course     He underwent cardiac catheterization on 11/12/2015 which showed 30% mid LAD that was previous treated with a stent, 40% prox to mid LCx, 30% ost RCA, 90% prox RCA, 50% mid RCA, EF 40-45%. He was originally planned for discharge on 6/19 to followup with Dr. Laneta Simmers as outpatient, he did have chest discomfort. He was placed on Imdur.   He was seen on following morning at which time he denies any discomfort. He is a poor historian, at first, he says he has weakness on L side, but later mentions this weakness has been present since previous stroke. He is deemed  stable for discharge from cardiology perspective to refer back to Dr. Laneta Simmers for consideration of thoracic aortic root repair with CABG. One month followup has been arranged with cardiology office.   _____________  Discharge Vitals Blood pressure 133/66, pulse 66, temperature 98.1 F (36.7 C), temperature source Oral, resp. rate 20, height  (1.854 m), weight 250 lb (113.399 kg),  SpO2 96 %.  Filed Weights   11/12/15 0733  Weight: 250 lb (113.399 kg)    Labs & Radiologic Studies     CBC  Recent Labs  11/12/15 0730 11/13/15 0255  WBC 4.9 6.8  HGB 12.9* 12.6*  HCT 38.4* 37.8*  MCV 94.6 96.9  PLT 169 180   Basic Metabolic Panel  Recent Labs  11/12/15 0730 11/13/15 0255  NA 138 139  K 3.6 3.7  CL 108 109  CO2 25 22  GLUCOSE 105* 102*  BUN 11 12  CREATININE 1.21 1.19  CALCIUM 8.9 8.9   Dg Chest Portable 1 View  10/15/2015  CLINICAL DATA:  Chest pain since last night. History of thoracic aneurysm, hypertension and congestive heart failure. EXAM: PORTABLE CHEST 1 VIEW COMPARISON:  CT 08/30/2015.  Radiographs 07/02/2014. FINDINGS: 1245 hours. The heart size and mediastinal contours are stable. The heart is mildly enlarged. The lungs are clear. There is no pleural effusion or pneumothorax. The bones appear unremarkable. Telemetry leads overlie the chest. IMPRESSION: Stable chest.  No acute cardiopulmonary process. Electronically Signed   By: Carey Bullocks M.D.   On: 10/15/2015 12:59   Ct Angio Chest/abd/pel For Dissection W And/or W/wo  10/15/2015  CLINICAL DATA:  Recurrent chest pain since last night, temporarily relieved by nitroglycerin. History of thoracic aortic aneurysm. EXAM: CT ANGIOGRAPHY CHEST, ABDOMEN AND PELVIS TECHNIQUE: Multidetector CT imaging through the chest, abdomen and pelvis was performed using the standard protocol during bolus administration of intravenous contrast. Multiplanar reconstructed images and MIPs were obtained and reviewed to evaluate the vascular anatomy. CONTRAST:  100 ml Isovue-370. COMPARISON:  CT abdomen 02/12/2015.  Chest CTA 08/30/2015. FINDINGS: CTA CHEST FINDINGS Mediastinum: Pre contrast images demonstrate diffuse atherosclerosis, most advanced within the coronary arteries. There are no displaced intimal calcifications within the thoracic aorta. Postcontrast, the aortic lumen opacifies normally. There is no evidence  of dissection. There is persistent dilatation of the aortic root, measuring up to 5.9 x 5.5 cm on image 83. The pulmonary arteries are suboptimally opacified with contrast. There is no evidence of acute pulmonary embolism. The heart size is stable. There is no pericardial effusion. There are no enlarged mediastinal, hilar or axillary lymph nodes. The thyroid gland, trachea and esophagus demonstrate no significant findings. Lungs/Pleura: There is no pleural effusion.The lungs appear stable with mild biapical scarring and bibasilar atelectasis. There is no confluent airspace opacity or suspicious pulmonary nodule. Musculoskeletal/Chest [place]: No chest Boan lesion or acute osseous findings. Review of the MIP images confirms the above findings. CTA ABDOMEN AND PELVIS FINDINGS Hepatobiliary: The hepatic density is diffusely decreased consistent with steatosis. There is relative sparing with around the gallbladder. No focal hepatic lesions are identified. No evidence of gallstones, gallbladder [place] thickening or biliary dilatation. Pancreas: Unremarkable. No pancreatic ductal dilatation or surrounding inflammatory changes. Spleen: Normal in size without focal abnormality. Adrenals/Urinary Tract: Both adrenal glands appear normal. Both kidneys demonstrate cortical scarring and small cysts. There is no evidence of renal mass, urinary tract calculus or hydronephrosis. Stomach/Bowel: No evidence of bowel W[place] thickening, distention or surrounding inflammatory change. Vascular/Lymphatic: There is diffuse atherosclerosis of the  aorta, its branches and the iliac arteries. 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 There is no evidence of large vessel occlusion, aneurysm or dissection. There are no enlarged abdominal pelvic lymph nodes. Reproductive: The prostate gland and seminal vesicles appear unremarkable. Other: Small umbilical hernia containing only fat. There are postsurgical changes in the anterior abdominal [place]. There is  dependent edema within the subcutaneous fat of the back. Musculoskeletal: No acute or significant osseous findings. Review of the MIP images confirms the above findings. IMPRESSION: 1. Stable diffuse aortic and branch vessel atherosclerosis. No evidence of aortic dissection or other acute vascular findings. 2. Stable dilatation of the aortic root compared with recent chest CTA of 6 weeks ago. 3. Hepatic steatosis, renal cortical thinning and probable small renal cysts. Electronically Signed   By: Carey BullocksWilliam  Veazey M.D.   On: 10/15/2015 17:10    Disposition   Pt is being discharged home today in good condition.  Follow-up Plans & Appointments    Follow-up Information    Follow up with Dina RichBranch, Jonathan, MD On 02/21/2016.   Specialty:  Cardiology   Why:  11:00AM. Cardiology followup.   Contact information:   [address] [place] [address] [phone]       Follow up with Joni ReiningKathryn Lawrence, NP On 11/29/2015.   Specialties:  Nurse Practitioner, Radiology, Cardiology   Why:  3:10 PM. Cardiology followup post cath   Contact information:   [address] [place] [address] [phone]       Follow up with Alleen BorneBryan K Bartle, MD On 11/14/2015.   Specialty:  Cardiothoracic Surgery   Why:  3:30PM. Cardiothoracic visit.    Contact information:   [address] [place] [address] [phone] 018 [address]      Discharge Instructions    Diet - low sodium heart healthy    Complete by:  As directed      Diet - low sodium heart healthy    Complete by:  As directed      Increase activity slowly    Complete by:  As directed      Increase activity slowly    Complete by:  As directed            Discharge Medications   Current Discharge Medication List    START taking these medications   Details  aspirin EC 81 MG EC tablet Take 1 tablet (81 mg total) by mouth daily.    isosorbide mononitrate (IMDUR) 30 MG 24 hr tablet Take 1 tablet (30 mg total) by mouth daily. Qty: 30  tablet, Refills: 6      CONTINUE these medications which have CHANGED   Details  amLODipine (NORVASC) 10 MG tablet Take 1 tablet (10 mg total) by mouth daily. Qty: 30 tablet, Refills: 3    rivaroxaban (XARELTO) 20 MG TABS tablet Take 1 tablet (20 mg total) by mouth daily with supper. Qty: 30 tablet      CONTINUE these medications which have NOT CHANGED   Details  allopurinol (ZYLOPRIM) 300 MG tablet Take 300 mg by mouth daily.    amiodarone (PACERONE) 200 MG tablet TAKE ONE TABLET BY MOUTH EVERY DAY Qty: 30 tablet, Refills: 6    atorvastatin (LIPITOR) 40 MG tablet Take 40 mg by mouth daily.      furosemide (LASIX) 40 MG tablet Take 40 mg by mouth daily.    levothyroxine (SYNTHROID, LEVOTHROID) 175 MCG tablet Take 175 mcg by mouth daily before breakfast.  lisinopril (PRINIVIL,ZESTRIL) 5 MG tablet Take 5 mg by mouth daily.    metoprolol (LOPRESSOR) 50 MG tablet Take 50 mg by mouth 2 (two) times daily.    Olopatadine HCl 0.2 % SOLN Apply 1 drop to eye.    Omega-3 Fatty Acids (SUPER OMEGA-EPA) 1000 MG CAPS Take 2 capsules by mouth 2 (two) times daily.     omeprazole (PRILOSEC) 20 MG capsule Take 20 mg by mouth daily.    polyethylene glycol (MIRALAX / GLYCOLAX) packet Take 17 g by mouth every other day.    senna (SENOKOT) 8.6 MG tablet Take 1 tablet by mouth 2 (two) times daily.    solifenacin (VESICARE) 5 MG tablet Take 5 mg by mouth daily.     tamsulosin (FLOMAX) 0.4 MG CAPS Take by mouth daily.    traMADol (ULTRAM) 50 MG tablet Take 50 mg by mouth every 8 (eight) hours.    nitroGLYCERIN (NITROSTAT) 0.4 MG SL tablet Place 0.4 mg under the tongue every 5 (five) minutes as needed.            Outstanding Labs/Studies   None  Duration of Discharge Encounter   Greater than 30 minutes including physician time.  Signed, Azalee Course PA-C 11/13/2015, 1:57 PM    Personally seen and examined. Agree with above.  Prox RCA 90%, aortic root 58mm.  Restart Xarelto Amio  200 QD at home previously on med lists.  Left arm weakness is chronic since stroke several years ago he tells me. No change since cath.   OK with DC. Close follow up with Dr. Laneta Simmers.   Donato Schultz, MD

## 2015-11-13 NOTE — Clinical Social Work Note (Signed)
Clinical Social Work Assessment  Patient Details  Name: Sean Hardy MRN: [id number] Date of Birth: April 13, 1951  Date of referral:  11/13/15               Reason for consult:  Discharge Planning                Permission sought to share information with:  Guardian Permission granted to share information::  Yes, Verbal Permission Granted  Name::     Betsey Amen  Agency::  SNF  Relationship::  Guardian  Contact Information:     Housing/Transportation Living arrangements for the past 2 months:  [place] of Information:  Patient Patient Interpreter Needed:  None Criminal Activity/Legal Involvement Pertinent to Current Situation/Hospitalization:    Significant Relationships:  Other(Comment) (Melissa Price) Lives with:  Self Do you feel safe going back to the place where you live?  Yes Need for family participation in patient care:  Yes (Comment)  Care giving concerns:  Patient is from [facility].   Social Worker assessment / plan:  CSW met with patient at beside to complete assessment. Patient was resting comfortably in bed. Patient reported he has been a resident of [facility] for a year. Patient reported he has a legal [place].Per patient request CSW called Betsey Amen and left message for return call. CSW called [facility] Coordinator Claiborne Billings and left message. CSW continuing to follow for discharge needs.   Employment status:  Disabled (Comment on whether or not currently receiving Disability) (Unknown if patient is receiving disability. ) Insurance information:  Medicaid In [place], [employer] PT Recommendations:  Not assessed at this time Information / Referral to community resources:  [place]  Patient/Family's Response to care:  The patient appears happy with the care he is receiving in hospital and is appreciative of CSW assistance.  Patient/Family's Understanding of and Emotional Response to Diagnosis,  Current Treatment, and Prognosis:  The patient has a good understanding of why he was admitted. He understands the care plan and what he will need post discharge.  Emotional Assessment Appearance:  Appears stated age Attitude/Demeanor/Rapport:   (Patient was welcoming of CSW and appropriate.) Affect (typically observed):  Accepting, Calm, Appropriate Orientation:  Oriented to Self, Oriented to Place, Oriented to  Time, Oriented to Situation Alcohol / Substance use:  Not Applicable Psych involvement (Current and /or in the community):  No (Comment)  Discharge Needs  Concerns to be addressed:  Discharge Planning Concerns Readmission within the last 30 days:  No Current discharge risk:  Physical Impairment Barriers to Discharge:  Continued Medical Work up   [employer], LCSW 11/13/2015, 1:00 PM

## 2015-11-14 ENCOUNTER — Encounter: Payer: Self-pay | Admitting: Surgery

## 2015-11-14 ENCOUNTER — Ambulatory Visit (INDEPENDENT_AMBULATORY_CARE_PROVIDER_SITE_OTHER): Payer: Medicare Other | Admitting: Surgery

## 2015-11-14 VITALS — BP 131/75 | HR 64 | Resp 18 | Ht 73.0 in | Wt 250.0 lb

## 2015-11-14 DIAGNOSIS — I2510 Atherosclerotic heart disease of native coronary artery without angina pectoris: Secondary | ICD-10-CM | POA: Diagnosis not present

## 2015-11-14 DIAGNOSIS — I719 Aortic aneurysm of unspecified site, without rupture: Secondary | ICD-10-CM

## 2015-11-14 DIAGNOSIS — I7121 Aneurysm of the ascending aorta, without rupture: Secondary | ICD-10-CM

## 2015-11-16 ENCOUNTER — Encounter: Payer: Self-pay | Admitting: Surgery

## 2015-11-16 NOTE — Progress Notes (Signed)
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 He has a state appointed guardian who is with him today. He was seen by Dr. Abelardo Diesel of [facility] on 08/21/2015 but I am not sure if that was his first encounter. He had a history of a thoracic aneurysm so a CTA of the chest was done at [facility] on 08/30/2015. 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 The ascending aorta was 4.3 cm and was unchanged. The transverse arch was 3.2 cm and unchanged and the descending aorta was 3.3 cm and unchanged. There were coronary artery calcifications. I saw him in consultation on 10/14/2015 and felt that surgical replacement of his aortic root aneurysm was indicated. He was seen by cardiology and underwent an echo showing an EF of 50-55% with moderate LVH. There was a trileaflet aortic valve with trivial AI, trivial MR. Cardiac cath showed a 90% proximal RCA stenosis. The proximal LAD had 30% narrowing within the previously placed stent. The LCX had 40% proximal stenosis. He has occasional chest pain with exertion.  Current Outpatient Prescriptions  Medication Sig Dispense Refill  . allopurinol (ZYLOPRIM) 300 MG tablet Take 300 mg by mouth daily.    Marland Kitchen amiodarone (PACERONE) 200 MG tablet TAKE ONE TABLET BY MOUTH EVERY DAY 30 tablet 6  . amLODipine (NORVASC) 5 MG tablet Take 5 mg by mouth daily.    Marland Kitchen atorvastatin (LIPITOR) 40 MG tablet Take 40 mg by mouth daily.      . Cholecalciferol (VITAMIN D3 ULTRA POTENCY) 16109 units TABS  Take 1 tablet by mouth every 30 (thirty) days. On the 15th of every month    . furosemide (LASIX) 40 MG tablet Take 40 mg by mouth daily.    Marland Kitchen levothyroxine (SYNTHROID, LEVOTHROID) 175 MCG tablet Take 175 mcg by mouth daily before breakfast.    . lisinopril (PRINIVIL,ZESTRIL) 5 MG tablet Take 5 mg by mouth daily.    . metoprolol (LOPRESSOR) 50 MG tablet Take 50 mg by mouth 2 (two) times daily.    . nitroGLYCERIN (NITROSTAT) 0.4 MG SL tablet Place 0.4 mg under the tongue every 5 (five) minutes as needed.      . Olopatadine HCl 0.2 % SOLN Apply 1 drop to eye.    . Omega-3 Fatty Acids (SUPER OMEGA-EPA) 1000 MG CAPS Take 2 capsules by mouth 2 (two) times daily.     Marland Kitchen omeprazole (PRILOSEC) 20 MG capsule Take 20 mg by mouth daily.    . polyethylene glycol (MIRALAX / GLYCOLAX) packet Take 17 g by mouth every other day.    . rivaroxaban (XARELTO) 20 MG TABS tablet Take 1 tablet (20 mg total) by mouth daily with supper. 30 tablet   . senna (SENOKOT) 8.6 MG tablet Take 1 tablet by mouth 2 (two) times daily.    . solifenacin (VESICARE) 5 MG tablet Take 5 mg by mouth daily.     . tamsulosin (FLOMAX) 0.4 MG CAPS Take by mouth daily.    . traMADol (ULTRAM) 50 MG tablet Take 50 mg by mouth  every 8 (eight) hours.    Marland Kitchen. aspirin EC 81 MG EC tablet Take 1 tablet (81 mg total) by mouth daily. (Patient not taking: Reported on 11/14/2015)    . isosorbide mononitrate (IMDUR) 30 MG 24 hr tablet Take 1 tablet (30 mg total) by mouth daily. (Patient not taking: Reported on 11/14/2015) 30 tablet 6   No current facility-administered medications for this visit.     Physical Exam: BP 131/75 mmHg  Pulse 64  Resp 18  Ht 6\' 1"  (1.854 m)  Wt 250 lb (113.399 kg)  BMI 32.99 kg/m2  SpO2 97% He looks well Cardiac exam shows a regular rate and rhythm with normal heart sounds and no murmur. Lungs are clear There is mild bilateral lower leg edema.  Diagnostic Tests:   *[facility]*  [address]  [place], [place] [id number]  [phone]  ------------------------------------------------------------------- Transthoracic Echocardiography  Patient: Sheralyn BoatmanWall, Michele L MR #: [id number] Study Date: 10/31/2015 Gender: M Age: [age] Height: 185.4 cm Weight: 113.4 kg BSA: 2.45 m^2 Pt. Status: Room:  ATTENDING Nona DellSamuel McDowell, M.D. Lisette AbuDERING Jonathon Branch, M.D. REFERRING Patrick JupiterJonathon Branch, M.D. PERFORMING [facility] [place] SONOGRAPHER Verlene MayerLaDonna Ellis, RCS  cc:  ------------------------------------------------------------------- LV EF: 50% - 55%  ------------------------------------------------------------------- History: PMH: Atrial flutter. Coronary artery disease. Cardiomyopathy of unknown etiology. Stroke. Risk factors: Former tobacco use. Hypertension. Obese.  ------------------------------------------------------------------- Study Conclusions  - Left ventricle: The cavity size was normal. Shima thickness was  increased in a pattern of moderate LVH. Systolic function was  normal. The estimated ejection fraction was in the range of 50%  to 55%. Diffuse hypokinesis. Left ventricular diastolic function  parameters were normal for the patient&'s age. - Aortic valve: There was trivial regurgitation. - Aorta: Aortic root dimension: 51 mm (ED). - Aortic root: The aortic root was moderately dilated. - Mitral valve: There was trivial regurgitation. - Left atrium: The atrium was moderately to severely dilated. - Right atrium: Central venous pressure (est): 3 mm Hg. - Tricuspid valve: There was trivial regurgitation. - Pulmonary arteries: Systolic pressure could not be accurately  estimated. - Pericardium, extracardiac: There was no pericardial effusion.  Impressions:  - Moderate LVH with LVEF 50-55%, grossly normal diastolic function.  Moderate to severe left  atrial enlargement. Trivial mitral  regurgitation. Moderately dilated aortic root measuring 51 mm on  this particular study. Trivial tricuspid regurgitation.  Transthoracic echocardiography. M-mode, complete 2D, spectral Doppler, and color Doppler. Birthdate: Patient birthdate: Oct 23, 1950. Age: Patient is 65 yr old. Sex: Gender: male. BMI: 33 kg/m^2. Blood pressure: 116/70 Patient status: Outpatient. Study date: Study date: 10/31/2015. Study time: 02:26 PM.  -------------------------------------------------------------------  ------------------------------------------------------------------- Left ventricle: The cavity size was normal. Archambault thickness was increased in a pattern of moderate LVH. Systolic function was normal. The estimated ejection fraction was in the range of 50% to 55%. Diffuse hypokinesis. Left ventricular diastolic function parameters were normal for the patient&'s age.  ------------------------------------------------------------------- Aortic valve: Probably trileaflet. Doppler: There was trivial regurgitation.  ------------------------------------------------------------------- Aorta: Aortic root: The aortic root was moderately dilated.  ------------------------------------------------------------------- Mitral valve: The valve appears to be grossly normal. Doppler: There was trivial regurgitation.  ------------------------------------------------------------------- Left atrium: The atrium was moderately to severely dilated.  ------------------------------------------------------------------- Right ventricle: The cavity size was normal. Systolic function was normal.  ------------------------------------------------------------------- Pulmonic valve: The valve appears to be grossly normal. Doppler: There was physiologic  regurgitation.  ------------------------------------------------------------------- Tricuspid valve: The valve appears to be grossly normal. Doppler: There was trivial regurgitation.  ------------------------------------------------------------------- Pulmonary artery: Systolic pressure  could not be accurately estimated.  ------------------------------------------------------------------- Right atrium: The atrium was normal in size.  ------------------------------------------------------------------- Pericardium: There was no pericardial effusion.  ------------------------------------------------------------------- Systemic veins: Inferior vena cava: The vessel was normal in size. The respirophasic diameter changes were in the normal range (= 50%), consistent with normal central venous pressure.  ------------------------------------------------------------------- Measurements  Left ventricle Value Reference LV ID, ED, PLAX chordal (H) 53.4 mm 43 - 52 LV ID, ES, PLAX chordal (H) 43.9 mm 23 - 38 LV fx shortening, PLAX chordal (L) 18 % >=29 LV PW thickness, ED 15.4 mm --------- IVS/LV PW ratio, ED 0.94 <=1.3 Stroke volume, 2D 100 ml --------- Stroke volume/bsa, 2D 41 ml/m^2 --------- LV ejection fraction, 1-p A4C 62 % --------- LV end-diastolic volume, 2-p 76 ml --------- LV end-systolic volume, 2-p 30 ml --------- LV ejection fraction, 2-p 60 % --------- Stroke volume, 2-p 45 ml --------- LV end-diastolic volume/bsa, 2-p 31 ml/m^2 --------- LV end-systolic volume/bsa, 2-p 12 ml/m^2 --------- Stroke volume/bsa, 2-p  18.5 ml/m^2 --------- LV e&', lateral 7.45 cm/s --------- LV E/e&', lateral 6.71 --------- LV e&', medial 5.9 cm/s --------- LV E/e&', medial 8.47 --------- LV e&', average 6.68 cm/s --------- LV E/e&', average 7.49 ---------  Ventricular septum Value Reference IVS thickness, ED 14.4 mm ---------  LVOT Value Reference LVOT ID, S 30 mm --------- LVOT area 7.07 cm^2 --------- LVOT ID 30 mm --------- LVOT peak velocity, S 70.2 cm/s --------- LVOT mean velocity, S 48.2 cm/s ---------  Aorta Value Reference Aortic root ID, ED 51 mm ---------  Left atrium Value Reference LA ID, A-P, ES 50 mm --------- LA ID/bsa, A-P 2.04 cm/m^2 <=2.2 LA volume, S 126 ml --------- LA volume/bsa, S 51.4 ml/m^2 --------- LA volume, ES, 1-p A4C 141 ml --------- LA volume/bsa, ES, 1-p A4C 57.6 ml/m^2 --------- LA volume, ES, 1-p A2C 92 ml --------- LA volume/bsa, ES, 1-p A2C 37.6 ml/m^2 ---------  Mitral valve Value Reference Mitral E-wave peak velocity 50 cm/s --------- Mitral deceleration time (L) 140 ms 150 - 230  Systemic veins  Value Reference Estimated CVP 3 mm Hg ---------  Right ventricle Value Reference TAPSE 26.3 mm --------- RV s&', lateral, S 10 cm/s ---------  Pulmonic valve Value Reference Pulmonic valve peak velocity, S 68.4 cm/s ---------  Legend: (L) and (H) mark values outside specified reference range.  ------------------------------------------------------------------- Prepared and Electronically Authenticated by  Nona DellSamuel McDowell, M.D. 2017-06-07T15:33:21    Lennette Biharihomas A Kelly, MD (Primary)      Procedures    Right/Left Heart Cath and Coronary Angiography    Conclusion     Mid LAD lesion, 30% stenosed. The lesion was previously treated with a stent (unknown type).  Prox Cx to Mid Cx lesion, 40% stenosed.  Ost RCA lesion, 30% stenosed.  Prox RCA lesion, 90% stenosed.  Mid RCA lesion, 50% stenosed.  There is mild to moderate left ventricular systolic dysfunction.  Markedly dilated thoracic aortic root and ascending aorta.  Trivial aortic insufficiency.  Mild pulmonary hypertension with mild elevation of right-sided heart pressures.  Mild - moderate LV dysfunction with diffuse 40-45% ejection fraction with more pronounced hypocontractility in the basal inferior [place]  Coronary calcification of the LAD with 30% narrowing in the proximal portion of the previously placed stent arising after the takeoff of the first diagonal in the region of the first septal perforating artery.  Moderate size left circumflex coronary artery with 40% proximal stenosis.  Calcified RCA with 30% ostial stenosis, diffuse 90% proximal stenosis and 50% mid stenosis.  RECOMMENDATION: The  patient will be referred back to Dr. Evelene Croon for surgery of his ascending aortic aneurysm and need for CABG to his  RCA.     Indications    Aortic root aneurysm (HCC) [I71.9 (ICD-10-CM)]   CAD in native artery [I25.10 (ICD-10-CM)]    Technique and Indications    Mr. Kohlton Gilpatrick is a 65 year old Caucasian male who underwent PCI with stenting to his LAD in 2003, at [facility]. The patient has been recently found to have a markedly dilated food. A CT scan has measured this at 5.8 cm. He has been evaluated by Dr. Laneta Simmers. The patient recently was admitted to [facility] with chest pain. The patient is referred by Dr. Wyline Mood for right and left heart catheterization.  The patient was brought to the second floor [facility]iac cath lab in the postabsorptive state. Versed 2 mg and fentanyl 25 mcg were initially administered for conscious sedation. The right groin was prepped and draped in sterile fashion and a 5 [place] arterial sheath and 7 French venous sheath were inserted without difficulty. A Swan-Ganz catheter was advanced into the venous sheath and pressures were obtained in the right atrium, right ventricle, pulmonary artery, and pulmonary capillary wedge position. Cardiac outputs were obtained by the thermodilution and assumed Fick methods. Oxygen saturation was obtained in the pulmonary artery and aorta. A pigtail catheter was inserted and simultaneous AO/PA pressures were recorded. The pigtail catheter was advanced into the left ventricle and simultaneous left ventricular and PCW pressures were recorded. Left ventriculography was performed in the RAO projection. A left ventricle to aorta pullback was performed. The pigtail catheter was then removed and diagnostic catheterization to delineate the coronary anatomy was performed utilizing 5 French Judkins 6 left for the left coronary system. Due to a superior and anterior takeoff in a markedly dilated aorta. Selective angiography into the RCA ostium was extremely difficult. Multiple catheters were utilized including a JR4, no torque right, a L2, AR-2, and  ultimately, a [employer] 3-D right guide Launcher catheter was successful to selectively cannulate the RCA. All catheters were removed and the patient. Hemostasis was obtained by direct manual pressure. The patient tolerated the procedure well and returned to his room in satisfactory condition.  During this procedure the patient was administered a total of Versed 3 mg and Fentanyl 50 mg to achieve and maintain moderate conscious sedation. The patient's heart rate, blood pressure, and oxygen saturation were monitored continuously during the procedure. The period of conscious sedation was 59 minutes, of which I was present face-to-face 100% of this time. Estimated blood loss <50 mL. There were no immediate complications during the procedure.    Coronary Findings    Dominance: Right   Left Main  The left main was a short vessel which bifurcated into the LAD and left circumflex vessel.     Left Anterior Descending  There was calcification in the proximal LAD. A stent was present which arose just after the proximal diagonal vessel. There was mild 30% narrowing in the region of the septal perforating artery at the beginning of the stented segment. The LAD gave rise to an additional diagonal vessel was free of significant disease.   . Mid LAD lesion, 30% stenosed. The lesion was previously treated with a stent (unknown type).     Left Circumflex  The left circumflex vessel had a 40% proximal stenosis.   . Prox Cx to Mid Cx lesion, 40% stenosed.     Right Coronary Artery  The RCA  had proximal calcification and a superior anterior takeoff. With the markedly dilated aortic root, cannulation of the RCA was difficult but ultimately successful with a 3D right launcher guide catheter.   Suezanne Jacquet RCA lesion, 30% stenosed.   . Prox RCA lesion, 90% stenosed.   . Mid RCA lesion, 50% stenosed.       Right Heart Pressures Hemodynamic findings consistent with mild pulmonary hypertension. LV EDP is normal.  RA: 10/8; 7 RV: 37/9 PA: 41/12 PW: 15  AO: 160/81 PA: 33/12  LV: 163/15 PW: 17/15; mean 12  LV: 166/13 AO: 166/82  Cardiac output: 5.4 liters/minute (thermodilution); 6.3 (Fick) Cardiac Index: 2.3 liters/minute/meter squared; 2.7    [place]                 Left Heart    Left Ventricle The left ventricle is enlarged. There is mild to moderate left ventricular systolic dysfunction. There is mild-to-moderate LV dysfunction with an ejection fraction of approximately 40-45%. There appears to be a focal area of mid to basal inferior hypocontractility.   Aortic Valve , and trivial (1+) aortic regurgitation.   Aorta The ascending aorta is severely dilated. The aorta is aneurysmal. Markedly enlarged and aneurysmal aortic root and ascending thoracic aortic aneurysm which previously had been documented to be 5.8 cm on CT evaluation.    Coronary Diagrams    Diagnostic Diagram            Implants     No implant documentation for this case.    PACS Images    Show images for Cardiac catheterization     Link to Procedure Log    Procedure Log      Hemo Data       Most Recent Value   Fick Cardiac Output  6.28 L/min   Fick Cardiac Output Index  2.71 (L/min)/BSA   Thermal Cardiac Output  5.43 L/min   Thermal Cardiac Output Index  2.34 (L/min)/BSA   RA A Wave  10 mmHg   RA V Wave  8 mmHg   RA Mean  7 mmHg   RV Systolic Pressure  37 mmHg   RV Diastolic Pressure  2 mmHg   RV EDP  9 mmHg   PA Systolic Pressure  33 mmHg   PA Diastolic Pressure  12 mmHg   PA Mean  21 mmHg   PW A Wave  18 mmHg   PW V Wave  13 mmHg   PW Mean  11 mmHg   AO Systolic Pressure  154 mmHg   AO Diastolic Pressure  76 mmHg   AO Mean  106 mmHg   LV Systolic Pressure  162 mmHg   LV Diastolic Pressure  8 mmHg   LV EDP  13 mmHg   Arterial Occlusion Pressure Extended Systolic Pressure  166 mmHg   Arterial Occlusion Pressure Extended Diastolic Pressure   82 mmHg   Arterial Occlusion Pressure Extended Mean Pressure  112 mmHg   Left Ventricular Apex Extended Systolic Pressure  166 mmHg   Left Ventricular Apex Extended Diastolic Pressure  9 mmHg   Left Ventricular Apex Extended EDP Pressure  13 mmHg   TPVR Index  9.82 HRUI   TSVR Index  46.58 HRUI   PVR SVR Ratio  0.13   TPVR/TSVR Ratio  0.21         Impression:  He has a 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 His aortic valve is difficult to see in detail on  2D echo due to his body size but there is only trivial AI and I think a valve sparing root replacement may be possible. He would also need a bypass to the distal RCA and with his history of atrial fib/flutter and prior stroke I would clip the LAA. I reviewed the studies with the patient and his guardian and all questions were answered. I discussed the operative procedure with the patient and his guardian from social services including alternatives, benefits and risks; including but not limited to bleeding, blood transfusion, infection, stroke, myocardial infarction, graft failure, heart block requiring a permanent pacemaker, organ dysfunction, and death.  Javarion L Recht understands and agrees to proceed.    Plan:  Valve sparing aortic root replacement and CABG to the RCA, clipping of the LAA on 12/03/2015   Alleen Borne, MD [facility] [phone]

## 2015-11-19 ENCOUNTER — Other Ambulatory Visit: Payer: Self-pay | Admitting: *Deleted

## 2015-11-19 DIAGNOSIS — I719 Aortic aneurysm of unspecified site, without rupture: Secondary | ICD-10-CM

## 2015-11-19 DIAGNOSIS — I7121 Aneurysm of the ascending aorta, without rupture: Secondary | ICD-10-CM

## 2015-11-19 DIAGNOSIS — I2510 Atherosclerotic heart disease of native coronary artery without angina pectoris: Secondary | ICD-10-CM

## 2015-11-24 DIAGNOSIS — I7121 Aneurysm of the ascending aorta, without rupture: Secondary | ICD-10-CM

## 2015-11-24 DIAGNOSIS — I719 Aortic aneurysm of unspecified site, without rupture: Secondary | ICD-10-CM

## 2015-11-24 HISTORY — DX: Aortic aneurysm of unspecified site, without rupture: I71.9

## 2015-11-24 HISTORY — DX: Aneurysm of the ascending aorta, without rupture: I71.21

## 2015-11-29 ENCOUNTER — Inpatient Hospital Stay (HOSPITAL_COMMUNITY): Payer: Medicare Other | Admitting: Anesthesiology

## 2015-11-29 ENCOUNTER — Ambulatory Visit (INDEPENDENT_AMBULATORY_CARE_PROVIDER_SITE_OTHER): Payer: Medicare Other | Admitting: Adult Health

## 2015-11-29 ENCOUNTER — Encounter: Payer: Self-pay | Admitting: Adult Health

## 2015-11-29 VITALS — BP 110/70 | HR 61 | Ht 73.0 in | Wt 256.0 lb

## 2015-11-29 DIAGNOSIS — Z7901 Long term (current) use of anticoagulants: Secondary | ICD-10-CM | POA: Diagnosis not present

## 2015-11-29 DIAGNOSIS — I2510 Atherosclerotic heart disease of native coronary artery without angina pectoris: Secondary | ICD-10-CM

## 2015-11-29 DIAGNOSIS — I712 Thoracic aortic aneurysm, without rupture, unspecified: Secondary | ICD-10-CM

## 2015-11-29 DIAGNOSIS — Z5181 Encounter for therapeutic drug level monitoring: Secondary | ICD-10-CM | POA: Diagnosis not present

## 2015-11-29 NOTE — Patient Instructions (Signed)
Your physician recommends that you schedule a follow-up appointment in: 6 Weeks after surgery.   Your physician recommends that you continue on your current medications as directed. Please refer to the Current Medication list given to you today.  If you need a refill on your cardiac medications before your next appointment, please call your pharmacy.  Thank you for choosing [facility]!

## 2015-11-29 NOTE — Progress Notes (Signed)
CADCardiology Office Note   Date:  11/29/2015   ID:  Sean BoatmanDanny L Hardy, DOB 06/30/1950, MRN [id number]  PCP:  Sean Hardy,[name], MD  Cardiologist: Sean Hardy/  Sean Lawrence, NP   Chief Complaint  Patient presents with  . Coronary Artery Disease  . AAA      History of Present Illness: Sean Hardy is a 65 y.o. male who is normally seen in the [place] office, who presents for ongoing assessment and management of CAD, with hix of PCI to the LAD in 2003. He was seen by Dr. Wyline MoodBranch in May of 2017 . He has a thoracic AAA and was sent for RHC/LHC as part of pre-operative evaluation.   Cardiac Cath 11/12/2015  Mid LAD lesion, 30% stenosed. The lesion was previously treated with a stent (unknown type).  Prox Cx to Mid Cx lesion, 40% stenosed.  Ost RCA lesion, 30% stenosed.  Prox RCA lesion, 90% stenosed.  Mid RCA lesion, 50% stenosed.  There is mild to moderate left ventricular systolic dysfunction.Mild - moderate LV dysfunction with diffuse 40-45% ejection fraction with more pronounced hypocontractility in the basal inferior Cena.  The patient will be referred back to Dr. Evelene CroonBryan Bartle for surgery of his ascending aortic aneurysm and need for CABG to his RCA. He was seen by Dr. Laneta SimmersBartle on 11/16/2015 for evaluation. He was planned for CABG and AAA repair.  [facility], he is due to have surgery with Dr. Lavinia Hardy on 03-27-16. He is here at the request of his primary care physician concerning need to bridge XARELTO prior to surgery. He offers no complaints of chest pain shortness of breath dizziness or abdominal pain.  Past Medical History  Diagnosis Date  . Arteriosclerotic cardiovascular disease (ASCVD) 2006    St[facility] to unknown arteries [facility] ; echo in 2006-normal EF, moderate LVH, mild inferior hypokinesis  . Hypertension   . 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 Hypothyroidism   . Atrial  flutter (HCC) 2012    Left atrial appendage thrombus with SEC; converted on amiodarone to marked SB  . Cardiomyopathy 2012    CHF in 10/2010; possibly tachycardia mediated; EF of 20-25%  . Chronic anticoagulation 2012    Discontinued  . Tobacco abuse     40 pack years; Tapering use; 10/2012:1/5 pack per day  . Gout   . Benign prostatic hypertrophy   . Nephrolithiasis     Lithotripsy; left ureteral calculus in 10/2012  . Stroke (HCC)   . Anxiety   . Major depressive disorder (HCC)   . CHF (congestive heart failure) (HCC)   . Hyperlipidemia   . Peripheral vascular disease (HCC)   . Atrial fibrillation (HCC)   . GERD (gastroesophageal reflux disease)   . Muscle weakness   . Aortic aneurysm, thoracic (HCC)   . Coronary artery disease   . High cholesterol     Past Surgical History  Procedure Laterality Date  . Lithotripsy    . Cardioversion    . Appendectomy    . Cataract extraction w/ intraocular lens  implant, bilateral Bilateral   . Coronary angioplasty with stent placement  2003; 2006    PCI to his LAD in 2003; unknown location/notes 10/18/2015  . Cardiac catheterization  11/12/2015  . Cardiac catheterization N/A 11/12/2015    Procedure: Right/Left Heart Cath and Coronary Angiography;  Surgeon: Lennette Biharihomas A Kelly, MD;  [facility] INV[facility]IVE CV LAB;  Service:  Cardiovascular;  Laterality: N/A;  . Aortogram  11/12/2015    Procedure: Aortogram;  Surgeon: Lennette Bihari, MD;  Location: [facility] INVASIVE CV LAB;  Service: Cardiovascular;;     Current Outpatient Prescriptions  Medication Sig Dispense Refill  . allopurinol (ZYLOPRIM) 300 MG tablet Take 300 mg by mouth daily.    Marland Kitchen amiodarone (PACERONE) 200 MG tablet TAKE ONE TABLET BY MOUTH EVERY DAY 30 tablet 6  . amLODipine (NORVASC) 5 MG tablet Take 5 mg by mouth daily.    Marland Kitchen aspirin EC 81 MG EC tablet Take 1 tablet (81 mg total) by mouth daily.    Marland Kitchen atorvastatin (LIPITOR) 40 MG tablet Take 40 mg by mouth daily.      . Cholecalciferol  (VITAMIN D3 ULTRA POTENCY) [address] units TABS Take 1 tablet by mouth every 30 (thirty) days. On the 15th of every month    . furosemide (LASIX) 40 MG tablet Take 40 mg by mouth daily.    . isosorbide mononitrate (IMDUR) 30 MG 24 hr tablet Take 1 tablet (30 mg total) by mouth daily. 30 tablet 6  . levothyroxine (SYNTHROID, LEVOTHROID) 175 MCG tablet Take 175 mcg by mouth daily before breakfast.    . lisinopril (PRINIVIL,ZESTRIL) 5 MG tablet Take 5 mg by mouth daily.    . metoprolol (LOPRESSOR) 50 MG tablet Take 50 mg by mouth 2 (two) times daily.    . Olopatadine HCl 0.2 % SOLN Apply 1 drop to eye.    Marland Kitchen omeprazole (PRILOSEC) 20 MG capsule Take 20 mg by mouth daily.    . rivaroxaban (XARELTO) 20 MG TABS tablet Take 1 tablet (20 mg total) by mouth daily with supper. 30 tablet   . senna (SENOKOT) 8.6 MG tablet Take 1 tablet by mouth 2 (two) times daily.    . solifenacin (VESICARE) 5 MG tablet Take 5 mg by mouth daily.     . tamsulosin (FLOMAX) 0.4 MG CAPS Take by mouth daily.    . traMADol (ULTRAM) 50 MG tablet Take 50 mg by mouth every 8 (eight) hours.    . nitroGLYCERIN (NITROSTAT) 0.4 MG SL tablet Place 0.4 mg under the tongue every 5 (five) minutes as needed. Reported on 11/29/2015    . Omega-3 Fatty Acids (SUPER OMEGA-EPA) 1000 MG CAPS Take 2 capsules by mouth 2 (two) times daily. Reported on 11/29/2015    . polyethylene glycol (MIRALAX / GLYCOLAX) packet Take 17 g by mouth every other day. Reported on 11/29/2015     No current facility-administered medications for this visit.    Allergies:   Penicillins    Social History:  The patient  reports that he quit smoking about 2 years ago. His smoking use included Cigarettes. He started smoking about 58 years ago. He has a 20 pack-year smoking history. He has never used smokeless tobacco. He reports that he drinks about 1.2 oz of alcohol per week. He reports that he does not use illicit drugs.   Family History:  The patient's family history includes  Dementia in his father and mother.    ROS: All other systems are reviewed and negative. Unless otherwise mentioned in H&P    PHYSICAL EXAM: VS:  BP 110/70 mmHg  Pulse 61  Ht  (1.854 m)  Wt 256 lb (116.121 kg)  BMI 33.78 kg/m2  SpO2 94% , BMI Body mass index is 33.78 kg/(m^2). GEN: Well nourished, well developed, in no acute distress HEENT: normal Neck: no JVD, carotid bruits, or masses Cardiac: RRR; no  murmurs, rubs, or gallops,no edema  Respiratory:  Clear to auscultation bilaterally, normal work of breathing GI: soft, nontender, nondistended, + BS MS: no deformity or atrophy Skin: warm and dry, no rash Neuro:  Strength and sensation are intact Psych: euthymic mood, full affect  Recent Labs: 11/13/2015: BUN 12; Creatinine, Ser 1.19; Hemoglobin 12.6*; Platelets 180; Potassium 3.7; Sodium 139    Lipid Panel    Component Value Date/Time   CHOL 153 10/25/2010 0646   TRIG 101 10/25/2010 0646   HDL 29* 10/25/2010 0646   CHOLHDL 5.3 10/25/2010 0646   VLDL 20 10/25/2010 0646   LDLCALC * 10/25/2010 0646    104        Total Cholesterol/HDL:CHD Risk Coronary Heart Disease Risk Table                     Men   Women  1/2 Average Risk   3.4   3.3  Average Risk       5.0   4.4  2 X Average Risk   9.6   7.1  3 X Average Risk  23.4   11.0        Use the calculated Patient Ratio above and the CHD Risk Table to determine the patient's CHD Risk.        ATP III CLASSIFICATION (LDL):  <100     mg/dL   Optimal  161-096100-129  mg/dL   Near or Above                    Optimal  130-159  mg/dL   Borderline  045-409160-189  mg/dL   High  >811>190     mg/dL   Very High      Wt Readings from Last 3 Encounters:  11/29/15 256 lb (116.121 kg)  11/14/15 250 lb (113.399 kg)  11/12/15 250 lb (113.399 kg)     ASSESSMENT AND PLAN:  1.  Coronary artery disease: Plan for coronary artery bypass grafting on December 10, 2015. He will not need to stop XARELTO and have bridging prior to surgery. He will stop  the XARELTO 48 hours prior to surgery, and start again as soon as he is surgeon deems appropriate and safe. This is been double checked with our pharmacist and Coumadin clinic.  I have written this on a referral letter along with his progress notes for [facility]. He'll continue beta blocker. Operatively statin therapy and amiodarone.  2. Thoracic Aortic Aneurysm:  Surgical repair is planned for 2015/11/10 as well. The same recommendations apply concerning XARELTO.  3. Hypertension: blood pressure is well-controlled on current medication regimen as stated above, with amlodipine 5 mg daily as well.   Current medicines are reviewed at length with the patient today.    Labs/ tests ordered today include:  No orders of the defined types were placed in this encounter.     Disposition:   FU with 6 weeks postoperatively  Signed, Joni ReiningKathryn Lawrence, NP  11/29/2015 2:58 PM    [facility] [address], [place], [place] [address] Phone: [phone]; Fax: [phone]

## 2015-11-29 NOTE — Progress Notes (Signed)
Name: Sean Hardy    DOB: May 10, 1951  Age: 65 y.o.  MR#: [id number]       PCP:  Kirstie Peri, MD      Insurance: Payor: [employer] MEDICARE / Plan: UHC MEDICARE / Product Type: *No Product type* /   CC:    Chief Complaint  Patient presents with  . Coronary Artery Disease  . AAA    VS Filed Vitals:   11/29/15 1447  Pulse: 61  Height: 6\' 1"  (1.854 m)  Weight: 256 lb (116.121 kg)  SpO2: 94%    Weights Current Weight  11/29/15 256 lb (116.121 kg)  11/14/15 250 lb (113.399 kg)  11/12/15 250 lb (113.399 kg)    Blood Pressure  BP Readings from Last 3 Encounters:  11/14/15 131/75  11/13/15 133/66  10/18/15 139/85     Admit date:  (Not on file) Last encounter with RMR:  Visit date not found   Allergy Penicillins  Current Outpatient Prescriptions  Medication Sig Dispense Refill  . allopurinol (ZYLOPRIM) 300 MG tablet Take 300 mg by mouth daily.    Marland Kitchen amiodarone (PACERONE) 200 MG tablet TAKE ONE TABLET BY MOUTH EVERY DAY 30 tablet 6  . amLODipine (NORVASC) 5 MG tablet Take 5 mg by mouth daily.    Marland Kitchen aspirin EC 81 MG EC tablet Take 1 tablet (81 mg total) by mouth daily.    Marland Kitchen atorvastatin (LIPITOR) 40 MG tablet Take 40 mg by mouth daily.      . Cholecalciferol (VITAMIN D3 ULTRA POTENCY) 78295 units TABS Take 1 tablet by mouth every 30 (thirty) days. On the 15th of every month    . furosemide (LASIX) 40 MG tablet Take 40 mg by mouth daily.    . isosorbide mononitrate (IMDUR) 30 MG 24 hr tablet Take 1 tablet (30 mg total) by mouth daily. 30 tablet 6  . levothyroxine (SYNTHROID, LEVOTHROID) 175 MCG tablet Take 175 mcg by mouth daily before breakfast.    . lisinopril (PRINIVIL,ZESTRIL) 5 MG tablet Take 5 mg by mouth daily.    . metoprolol (LOPRESSOR) 50 MG tablet Take 50 mg by mouth 2 (two) times daily.    . Olopatadine HCl 0.2 % SOLN Apply 1 drop to eye.    Marland Kitchen omeprazole (PRILOSEC) 20 MG capsule Take 20 mg by mouth daily.    . rivaroxaban (XARELTO) 20 MG TABS tablet Take 1  tablet (20 mg total) by mouth daily with supper. 30 tablet   . senna (SENOKOT) 8.6 MG tablet Take 1 tablet by mouth 2 (two) times daily.    . solifenacin (VESICARE) 5 MG tablet Take 5 mg by mouth daily.     . tamsulosin (FLOMAX) 0.4 MG CAPS Take by mouth daily.    . traMADol (ULTRAM) 50 MG tablet Take 50 mg by mouth every 8 (eight) hours.    . nitroGLYCERIN (NITROSTAT) 0.4 MG SL tablet Place 0.4 mg under the tongue every 5 (five) minutes as needed. Reported on 11/29/2015    . Omega-3 Fatty Acids (SUPER OMEGA-EPA) 1000 MG CAPS Take 2 capsules by mouth 2 (two) times daily. Reported on 11/29/2015    . polyethylene glycol (MIRALAX / GLYCOLAX) packet Take 17 g by mouth every other day. Reported on 11/29/2015     No current facility-administered medications for this visit.    Discontinued Meds:   There are no discontinued medications.  Patient Active Problem List   Diagnosis Date Noted  . CAD in native artery   . Aneurysm of aortic  root (HCC)   . Chest pain 10/15/2015  . Aneurysm of thoracic aorta (HCC) 10/15/2015  . 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 Aortic root aneurysm (HCC) 10/14/2015  . CKD (chronic kidney disease) stage 3, GFR 30-59 ml/min 11/05/2012  . Nephrolithiasis   . Atrial flutter (HCC)   . Tachycardia induced cardiomyopathy (HCC)   . Gout   . History of CVA (cerebrovascular accident)   . Hypothyroidism   . 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 Hypertension 11/15/2010  . Tobacco abuse 11/15/2010  . Arteriosclerotic cardiovascular disease (ASCVD) 11/15/2010    LABS    Component Value Date/Time   NA 139 11/13/2015 0255   NA 138 11/12/2015 0730   NA 141 10/15/2015 1252   K 3.7 11/13/2015 0255   K 3.6 11/12/2015 0730   K 4.1 10/15/2015 1252   CL 109 11/13/2015 0255   CL 108 11/12/2015 0730   CL 100* 10/15/2015 1252   CO2 22 11/13/2015 0255   CO2 25 11/12/2015 0730   CO2 28 10/15/2015 1238   GLUCOSE 102* 11/13/2015 0255   GLUCOSE 105* 11/12/2015 0730   GLUCOSE  103* 10/15/2015 1252   BUN 12 11/13/2015 0255   BUN 11 11/12/2015 0730   BUN 15 10/15/2015 1252   CREATININE 1.19 11/13/2015 0255   CREATININE 1.21 11/12/2015 0730   CREATININE 1.40* 10/15/2015 1252   CREATININE 1.48* 10/27/2012 1508   CREATININE 1.21 12/02/2010 1620   CREATININE 1.40* 11/19/2010 1320   CALCIUM 8.9 11/13/2015 0255   CALCIUM 8.9 11/12/2015 0730   CALCIUM 9.1 10/15/2015 1238   GFRNONAA >60 11/13/2015 0255   GFRNONAA >60 11/12/2015 0730   GFRNONAA 56* 10/15/2015 1238   GFRAA >60 11/13/2015 0255   GFRAA >60 11/12/2015 0730   GFRAA >60 10/15/2015 1238   CMP     Component Value Date/Time   NA 139 11/13/2015 0255   K 3.7 11/13/2015 0255   CL 109 11/13/2015 0255   CO2 22 11/13/2015 0255   GLUCOSE 102* 11/13/2015 0255   BUN 12 11/13/2015 0255   CREATININE 1.19 11/13/2015 0255   CREATININE 1.48* 10/27/2012 1508   CALCIUM 8.9 11/13/2015 0255   PROT 6.7 10/27/2012 1508   ALBUMIN 4.2 10/27/2012 1508   AST 15 10/27/2012 1508   ALT 16 10/27/2012 1508   ALKPHOS 52 10/27/2012 1508   BILITOT 0.5 10/27/2012 1508   GFRNONAA >60 11/13/2015 0255   GFRAA >60 11/13/2015 0255       Component Value Date/Time   WBC 6.8 11/13/2015 0255   WBC 4.9 11/12/2015 0730   WBC 6.2 10/15/2015 1238   HGB 12.6* 11/13/2015 0255   HGB 12.9* 11/12/2015 0730   HGB 14.3 10/15/2015 1252   HCT 37.8* 11/13/2015 0255   HCT 38.4* 11/12/2015 0730   HCT 42.0 10/15/2015 1252   MCV 96.9 11/13/2015 0255   MCV 94.6 11/12/2015 0730   MCV 97.4 10/15/2015 1238    Lipid Panel     Component Value Date/Time   CHOL 153 10/25/2010 0646   TRIG 101 10/25/2010 0646   HDL 29* 10/25/2010 0646   CHOLHDL 5.3 10/25/2010 0646   VLDL 20 10/25/2010 0646   LDLCALC * 10/25/2010 0646    104        Total Cholesterol/HDL:CHD Risk Coronary Heart Disease Risk Table                     Men   Women  1/2 Average Risk   3.4  3.3  Average Risk       5.0   4.4  2 X Average Risk   9.6   7.1  3 X Average Risk  23.4    11.0        Use the calculated Patient Ratio above and the CHD Risk Table to determine the patient's CHD Risk.        ATP III CLASSIFICATION (LDL):  <100     mg/dL   Optimal  161-096100-129  mg/dL   Near or Above                    Optimal  130-159  mg/dL   Borderline  045-409160-189  mg/dL   High  >811>190     mg/dL   Very High    ABG    Component Value Date/Time   PHART 7.432 11/12/2015 0944   PCO2ART 35.6 11/12/2015 0944   PO2ART 58.0* 11/12/2015 0944   HCO3 23.8 11/12/2015 0944   TCO2 25 11/12/2015 0944   ACIDBASEDEF 1.0 11/12/2015 0943   O2SAT 91.0 11/12/2015 0944     Lab Results  Component Value Date   TSH 3.824 11/02/2012   BNP (last 3 results) No results for input(s): BNP in the last 8760 hours.  ProBNP (last 3 results) No results for input(s): PROBNP in the last 8760 hours.  Cardiac Panel (last 3 results) No results for input(s): CKTOTAL, CKMB, TROPONINI, RELINDX in the last 72 hours.  Iron/TIBC/Ferritin/ %Sat No results found for: IRON, TIBC, FERRITIN, IRONPCTSAT   EKG Orders placed or performed in visit on 11/12/15  . EKG 12-Lead     Prior Assessment and Plan Problem List as of 11/29/2015      Cardiovascular and Mediastinum   Atrial thrombus [facility](HCC)   Last Assessment & Plan 02/17/2011 Office Visit Written 02/17/2011  2:36 PM by Kathlen Brunswickobert M Rothbart, MD    Repeat imaging for evaluation of possible persistent thrombus will not be necessary unless radiofrequency ablation is required at some time in the future.      Hypertension   Last Assessment & Plan 10/27/2012 Office Visit Written 10/28/2012  8:42 AM by Kathlen Brunswickobert M Rothbart, MD    Excellent control of blood pressure in recent years; current therapy will be continued.      Arteriosclerotic cardiovascular disease (ASCVD)   Last Assessment & Plan 10/27/2012 Office Visit Written 10/28/2012  8:35 AM by Kathlen Brunswickobert M Rothbart, MD    We have been unable to locate specific information about patient's history of coronary disease or  percutaneous intervention. For now, he is asymptomatic with generally good control of cardiovascular risk factors.      Atrial flutter [facility](HCC)   Last Assessment & Plan 10/27/2012 Office Visit Edited 10/28/2012  9:36 AM by Kathlen Brunswickobert M Rothbart, MD    Atrial arrhythmias clinically controlled with amiodarone. Appropriate monitoring studies will be obtained.  In the absence of documented recurrence of arrhythmia and as the result of patient's previous medical noncompliance, full anticoagulation has been deferred.  Although with novel oral anticoagulants and improved supervision of his care, he could now be anticoagulated with reasonable safety, I will not plan to do so unless recurrent atrial fibrillation or flutter is documented.      Tachycardia induced cardiomyopathy [facility](HCC)   Last Assessment & Plan 12/17/2010 Office Visit Written 12/17/2010 12:22 PM by Kathlen Brunswickobert M Rothbart, MD    LV function has normalized with control of arrhythmia strongly suggesting that his original CHF was tachycardia mediated.  He has a small residual segmental Woodson motion abnormality and evaluation for possible coronary disease may be prudent at some point.  He developed prerenal azotemia with moderate dose diuretics.  Furosemide will be discontinued and chlorthalidone substituted at a low dose for treatment of hypertension with monitoring of electrolytes and renal function.      Aneurysm of thoracic aorta (HCC)   CAD in native artery     Endocrine   Hypothyroidism   Last Assessment & Plan 10/27/2012 Office Visit Written 10/28/2012  8:41 AM by Kathlen Brunswickobert M Rothbart, MD    No recent assessment of thyroid status, which is particularly important in the setting of amiodarone treatment. TSH will be measured.        Nervous and Auditory   Left hemiparesis from old CVA     Genitourinary   Nephrolithiasis   CKD (chronic kidney disease) stage 3, GFR 30-59 ml/min     Other   Tobacco abuse   Last Assessment & Plan 10/27/2012 Office Visit Written  10/28/2012  8:40 AM by Kathlen Brunswickobert M Rothbart, MD    Patient's caregivers are limiting his tobacco consumption. At current levels, harm is likely minimal.      History of CVA (cerebrovascular accident)   Gout   Aortic root aneurysm (HCC)   Chest pain   Aneurysm of aortic root (HCC)       Imaging: No results found.

## 2015-12-05 NOTE — Pre-Procedure Instructions (Signed)
Sean Hardy  12/05/2015      [facility] - [place], [place] - [address] [place] [address] Phone: [phone] Fax: [phone]  [facility] - [place], [place] - [address] AT [facility] [address] [place] [address] Phone: [phone] Fax: [phone]  [facility] - , [place] - [address]  [place] [address] Phone: [phone] Fax: [phone]  [facility] - [address] [place] - [address] [place] [address] Phone: [phone] Fax: [phone]  [facility] - [place], [place] - [address] [place] [address] Phone: [phone] Fax: [phone]    Your procedure is scheduled on July 17  Report to [facility] Admitting at 0530 A.M.  Call this number if you have problems the morning of surgery:  [phone]   Remember:  Do not eat food or drink liquids after midnight.   Take these medicines the morning of surgery with A SIP OF WATER allopurinol, amiodarone (pacerone), amlodipine (norvasc), isosorbide Mononitrate (IMDUR), levothyroxine (synthroid), metoprolol, nitroglycerine if needed, eye drops, omeprazole (prilosec), tamsulosin (flomax), tramadol if needed    Do not wear jewelry, make-up or nail polish.  Do not wear lotions, powders, or perfumes.  You may NOT wear deoderant.  Men may shave face and neck.  Do not bring valuables to the hospital.  [facility] is not responsible for any belongings or valuables.  Contacts, dentures or bridgework may not be worn into surgery.  Leave your suitcase in the car.  After surgery it may be brought to your room.  For patients admitted to the hospital, discharge time will be determined by your treatment team.  Patients discharged the day of surgery will not be allowed to drive home.    Special instructions:    [place]- Preparing For Surgery  Before surgery, you can play an important role. Because skin is not sterile, your skin needs to be as free of germs as possible. You can reduce the number of germs on your skin by washing with CHG (chlorahexidine gluconate) Soap before surgery.  CHG is an antiseptic cleaner which kills germs and bonds with the skin to continue killing germs even after washing.  Please do not use if you have an allergy to CHG or antibacterial soaps. If your skin becomes reddened/irritated stop using the CHG.  Do not shave (including legs and underarms) for at least 48 hours prior to first CHG shower. It is OK to shave your face.  Please follow these instructions carefully.   1. Shower the NIGHT BEFORE SURGERY and the MORNING OF SURGERY with CHG.   2. If you chose to wash your hair, wash your hair first as usual with your normal shampoo.  3. After you shampoo, rinse your hair and body thoroughly to remove the shampoo.  4. Use CHG as you would any other liquid soap. You can apply CHG directly to the skin and wash gently with a scrungie or a clean washcloth.   5. Apply the CHG Soap to your body ONLY FROM THE NECK DOWN.  Do not use on open wounds or open sores. Avoid contact with your eyes, ears, mouth and genitals (private parts). Wash genitals (private parts) with your normal soap.  6. Wash thoroughly, paying special attention to the area where your surgery will be performed.  7.  Thoroughly rinse your body with warm water from the neck down.  8. DO NOT shower/wash with your normal soap after using and rinsing off the CHG Soap.  9. Pat yourself dry with a CLEAN TOWEL.   10. Wear CLEAN PAJAMAS   11. Place CLEAN SHEETS on your bed the night of your first shower and DO NOT SLEEP WITH PETS.    Day of Surgery: Do not apply any deodorants/lotions. Please wear clean clothes to the hospital/surgery center.      Please read over the following fact sheets that you were  given. Pain Booklet, Coughing and Deep Breathing, Blood Transfusion Information, MRSA Information and Surgical Site Infection Prevention, incentive spirometer

## 2015-12-06 ENCOUNTER — Ambulatory Visit (HOSPITAL_COMMUNITY)
Admission: RE | Admit: 2015-12-06 | Discharge: 2015-12-06 | Disposition: A | Discharge status: Home / Self Care | Payer: Medicare Other | Source: Ambulatory Visit | Attending: Surgery | Admitting: Surgery

## 2015-12-06 ENCOUNTER — Encounter (HOSPITAL_COMMUNITY)
Admission: RE | Admit: 2015-12-06 | Discharge: 2015-12-06 | Disposition: A | Discharge status: Home / Self Care | Payer: Medicare Other | Source: Ambulatory Visit | Attending: Surgery | Admitting: Surgery

## 2015-12-06 ENCOUNTER — Encounter (HOSPITAL_COMMUNITY): Payer: Self-pay

## 2015-12-06 VITALS — BP 129/71 | HR 52 | Resp 20 | Ht 73.0 in | Wt 259.3 lb

## 2015-12-06 DIAGNOSIS — I6523 Occlusion and stenosis of bilateral carotid arteries: Secondary | ICD-10-CM | POA: Diagnosis not present

## 2015-12-06 DIAGNOSIS — I2510 Atherosclerotic heart disease of native coronary artery without angina pectoris: Secondary | ICD-10-CM

## 2015-12-06 DIAGNOSIS — Q2543 Congenital aneurysm of aorta: Secondary | ICD-10-CM | POA: Diagnosis not present

## 2015-12-06 DIAGNOSIS — Z0183 Encounter for blood typing: Secondary | ICD-10-CM | POA: Diagnosis not present

## 2015-12-06 DIAGNOSIS — Z01818 Encounter for other preprocedural examination: Secondary | ICD-10-CM | POA: Diagnosis present

## 2015-12-06 DIAGNOSIS — I719 Aortic aneurysm of unspecified site, without rupture: Secondary | ICD-10-CM | POA: Diagnosis not present

## 2015-12-06 DIAGNOSIS — I7121 Aneurysm of the ascending aorta, without rupture: Secondary | ICD-10-CM

## 2015-12-06 DIAGNOSIS — Z01812 Encounter for preprocedural laboratory examination: Secondary | ICD-10-CM | POA: Diagnosis not present

## 2015-12-06 LAB — VAS US DOPPLER PRE CABG
LEFT ECA DIAS: -22 cm/s
LEFT VERTEBRAL DIAS: 13 cm/s
LICADDIAS: -37 cm/s
LICAPDIAS: -34 cm/s
Left CCA dist dias: -21 cm/s
Left CCA dist sys: -90 cm/s
Left CCA prox dias: 22 cm/s
Left CCA prox sys: 113 cm/s
Left ICA dist sys: -116 cm/s
Left ICA prox sys: -104 cm/s
RCCADSYS: -67 cm/s
RCCAPDIAS: 15 cm/s
RIGHT ECA DIAS: -11 cm/s
Right CCA prox sys: 63 cm/s

## 2015-12-06 LAB — PULMONARY FUNCTION TEST
DL/VA % pred: 65 %
DL/VA: 3.07 ml/min/mmHg/L
DLCO UNC % PRED: 43 %
DLCO unc: 14.51 ml/min/mmHg
FEF 25-75 PRE: 1.67 L/s
FEF 25-75 Post: 0.61 L/sec
FEF2575-%Change-Post: -63 %
FEF2575-%PRED-PRE: 58 %
FEF2575-%Pred-Post: 21 %
FEV1-%Change-Post: -13 %
FEV1-%PRED-POST: 58 %
FEV1-%Pred-Pre: 67 %
FEV1-POST: 2.09 L
FEV1-PRE: 2.42 L
FEV1FVC-%Change-Post: 4 %
FEV1FVC-%Pred-Pre: 96 %
FEV6-%CHANGE-POST: -15 %
FEV6-%PRED-PRE: 71 %
FEV6-%Pred-Post: 60 %
FEV6-POST: 2.74 L
FEV6-Pre: 3.24 L
FEV6FVC-%Change-Post: 2 %
FEV6FVC-%PRED-POST: 104 %
FEV6FVC-%Pred-Pre: 102 %
FVC-%Change-Post: -16 %
FVC-%Pred-Post: 57 %
FVC-%Pred-Pre: 69 %
FVC-POST: 2.77 L
FVC-Pre: 3.34 L
POST FEV6/FVC RATIO: 99 %
PRE FEV1/FVC RATIO: 72 %
Post FEV1/FVC ratio: 76 %
Pre FEV6/FVC Ratio: 97 %
RV % pred: 114 %
RV: 2.73 L
TLC % PRED: 83 %
TLC: 5.99 L

## 2015-12-06 LAB — BLOOD GAS, ARTERIAL
ACID-BASE EXCESS: 0.4 mmol/L (ref 0.0–2.0)
BICARBONATE: 24.3 meq/L — AB (ref 20.0–24.0)
DRAWN BY: 421801
FIO2: 0.21
O2 SAT: 92 %
PATIENT TEMPERATURE: 98.6
PH ART: 7.424 (ref 7.350–7.450)
TCO2: 25.5 mmol/L (ref 0–100)
pCO2 arterial: 37.8 mmHg (ref 35.0–45.0)
pO2, Arterial: 65.6 mmHg — ABNORMAL LOW (ref 80.0–100.0)

## 2015-12-06 LAB — URINALYSIS, ROUTINE W REFLEX MICROSCOPIC
Glucose, UA: NEGATIVE mg/dL
HGB URINE DIPSTICK: NEGATIVE
KETONES UR: NEGATIVE mg/dL
Leukocytes, UA: NEGATIVE
Nitrite: NEGATIVE
PH: 6 (ref 5.0–8.0)
Protein, ur: NEGATIVE mg/dL
SPECIFIC GRAVITY, URINE: 1.021 (ref 1.005–1.030)

## 2015-12-06 LAB — CBC
HEMATOCRIT: 39.1 % (ref 39.0–52.0)
Hemoglobin: 13.2 g/dL (ref 13.0–17.0)
MCH: 33.2 pg (ref 26.0–34.0)
MCHC: 33.8 g/dL (ref 30.0–36.0)
MCV: 98.5 fL (ref 78.0–100.0)
PLATELETS: 202 10*3/uL (ref 150–400)
RBC: 3.97 MIL/uL — ABNORMAL LOW (ref 4.22–5.81)
RDW: 13.8 % (ref 11.5–15.5)
WBC: 5.4 10*3/uL (ref 4.0–10.5)

## 2015-12-06 LAB — COMPREHENSIVE METABOLIC PANEL
ALBUMIN: 3.4 g/dL — AB (ref 3.5–5.0)
ALT: 44 U/L (ref 17–63)
ANION GAP: 10 (ref 5–15)
AST: 30 U/L (ref 15–41)
Alkaline Phosphatase: 48 U/L (ref 38–126)
BILIRUBIN TOTAL: 0.7 mg/dL (ref 0.3–1.2)
BUN: 18 mg/dL (ref 6–20)
CHLORIDE: 109 mmol/L (ref 101–111)
CO2: 20 mmol/L — ABNORMAL LOW (ref 22–32)
Calcium: 9.1 mg/dL (ref 8.9–10.3)
Creatinine, Ser: 1.31 mg/dL — ABNORMAL HIGH (ref 0.61–1.24)
GFR calc Af Amer: >60 mL/min (ref >60)
GFR calc non Af Amer: 56 mL/min — ABNORMAL LOW (ref >60)
GLUCOSE: 100 mg/dL — AB (ref 65–99)
POTASSIUM: 4.2 mmol/L (ref 3.5–5.1)
Sodium: 139 mmol/L (ref 135–145)
TOTAL PROTEIN: 6.1 g/dL — AB (ref 6.5–8.1)

## 2015-12-06 LAB — ABO/RH: ABO/RH(D): A POS

## 2015-12-06 LAB — PROTIME-INR
INR: 1.87 — ABNORMAL HIGH (ref 0.00–1.49)
PROTHROMBIN TIME: 21.5 s — AB (ref 11.6–15.2)

## 2015-12-06 LAB — SURGICAL PCR SCREEN
MRSA, PCR: NEGATIVE
Staphylococcus aureus: NEGATIVE

## 2015-12-06 LAB — APTT: APTT: 36 s (ref 24–37)

## 2015-12-06 MED ORDER — ALBUTEROL SULFATE (2.5 MG/3ML) 0.083% IN NEBU
2.5000 mg | INHALATION_SOLUTION | Freq: Once | RESPIRATORY_TRACT | Status: AC
  Administered 2015-12-06: 2.5 mg via RESPIRATORY_TRACT

## 2015-12-06 NOTE — Progress Notes (Signed)
Pre-op Cardiac Surgery  Carotid Findings: Findings suggest 1-39% internal carotid artery stenosis bilaterally. Vertebral arteries are patent with antegrade flow.  Upper Extremity Right Left  Brachial Pressures 125-Triphasic 114-Triphasic  Radial Waveforms Triphasic Triphasic  Ulnar Waveforms Triphasic Triphasic  Palmar Arch (Allen's Test) Signal decreases >50% with radial compression, is unaffected with ulnar compression. Signal obliterates with radial compression, is unaffected with ulnar compression.   Lower  Extremity Right Left  Dorsalis Pedis 133-Triphasic 94-Monophasic  Anterior Tibial    Posterior Tibial 179-Triphasic 124-Triphasic  Ankle/Brachial Indices 1.43 0.99    Findings:   The right ABI is elevated, suggestive of possible medial calcification. The left ABI is within normal limits.  12/06/2015 11:01 AM Gertie FeyMichelle Humzah Harty, RVT, RDCS, RDMS

## 2015-12-06 NOTE — Progress Notes (Signed)
PCP - Kirstie PeriAshish Shah Cardiologist - unsure at this time will call legal gardian  Chest x-ray - 12/06/15 EKG - 11/12/15 Stress Test - 11/15/10 ECHO - 10/31/15 Cardiac Cath - 11/12/15    Patient denies shortness of breath, fever, cough and chest pain at PAT appointment  Patient has a legal guardian, who was not present at PAT appointment.  Called Efraim KaufmannMelissa Price who is legal guardian left message with her to go over surgery date and time.    Sending chart to anesthesia for review

## 2015-12-07 LAB — HEMOGLOBIN A1C
Hgb A1c MFr Bld: 5.6 % (ref 4.8–5.6)
Mean Plasma Glucose: 114 mg/dL

## 2015-12-07 MED ORDER — PHENYLEPHRINE HCL 10 MG/ML IJ SOLN
30.0000 ug/min | INTRAVENOUS | Status: DC
  Filled 2015-12-07: qty 2

## 2015-12-07 MED ORDER — NITROGLYCERIN IN D5W 200-5 MCG/ML-% IV SOLN
2.0000 ug/min | INTRAVENOUS | Status: DC
  Filled 2015-12-07: qty 250

## 2015-12-07 MED ORDER — DEXTROSE 5 % IV SOLN
750.0000 mg | INTRAVENOUS | Status: DC
  Filled 2015-12-07: qty 750

## 2015-12-07 MED ORDER — SODIUM CHLORIDE 0.9 % IV SOLN
INTRAVENOUS | Status: DC
  Filled 2015-12-07: qty 30

## 2015-12-07 MED ORDER — MAGNESIUM SULFATE 50 % IJ SOLN
40.0000 meq | INTRAMUSCULAR | Status: DC
  Filled 2015-12-07: qty 10

## 2015-12-07 MED ORDER — DEXMEDETOMIDINE HCL IN NACL 400 MCG/100ML IV SOLN
0.1000 ug/kg/h | INTRAVENOUS | Status: DC
  Filled 2015-12-07: qty 100

## 2015-12-07 MED ORDER — CEFUROXIME SODIUM 1.5 G IJ SOLR
1.5000 g | INTRAMUSCULAR | Status: DC
  Filled 2015-12-07 (×2): qty 1.5

## 2015-12-07 MED ORDER — SODIUM CHLORIDE 0.9 % IV SOLN
INTRAVENOUS | Status: DC
  Filled 2015-12-07: qty 2.5

## 2015-12-07 MED ORDER — METOPROLOL TARTRATE 12.5 MG HALF TABLET: 12.5000 mg | ORAL_TABLET | Freq: Once | ORAL | Status: DC

## 2015-12-07 MED ORDER — EPINEPHRINE HCL 1 MG/ML IJ SOLN
0.0000 ug/min | INTRAVENOUS | Status: DC
  Filled 2015-12-07: qty 4

## 2015-12-07 MED ORDER — DOPAMINE-DEXTROSE 3.2-5 MG/ML-% IV SOLN
0.0000 ug/kg/min | INTRAVENOUS | Status: DC
  Filled 2015-12-07: qty 250

## 2015-12-07 MED ORDER — POTASSIUM CHLORIDE 2 MEQ/ML IV SOLN
80.0000 meq | INTRAVENOUS | Status: DC
  Filled 2015-12-07: qty 40

## 2015-12-07 MED ORDER — VANCOMYCIN HCL 10 G IV SOLR
1500.0000 mg | INTRAVENOUS | Status: DC
  Filled 2015-12-07: qty 1500

## 2015-12-07 MED ORDER — SODIUM CHLORIDE 0.9 % IV SOLN
INTRAVENOUS | Status: DC
  Filled 2015-12-07: qty 40

## 2015-12-07 MED ORDER — PLASMA-LYTE 148 IV SOLN
INTRAVENOUS | Status: DC
  Filled 2015-12-07: qty 2.5

## 2015-12-07 MED ORDER — CHLORHEXIDINE GLUCONATE 0.12 % MT SOLN
15.0000 mL | Freq: Once | OROMUCOSAL | Status: AC
  Administered 2015-12-10: 15 mL via OROMUCOSAL
  Filled 2015-12-07: qty 15

## 2015-12-09 NOTE — H&P (Signed)
[address]       Sean Hardy [address]             [phone]      Cardiothoracic Surgery History and Physical  PCP is Sean Peri, MD Referring Provider is Clevenger, Lacretia Leigh, MD Cardiolgist in the [facility] is Sean Rich, MD  Chief Complaint  Patient presents with  . 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 He has a state appointed guardian who is with him today. He was seen by Dr. Abelardo Hardy of [facility] on 08/21/2015 but I am not sure if that was his first encounter. He had a history of a thoracic aneurysm so a CTA of the chest was done at [facility] on 08/30/2015. 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 The ascending aorta was 4.3 cm and was unchanged. The transverse arch was 3.2 cm and unchanged and the descending aorta was 3.3 cm and unchanged. There were coronary artery calcifications. I saw him in consultation on 10/14/2015 and felt that surgical replacement of his aortic root aneurysm was indicated. He was seen by cardiology and underwent an echo showing an EF of 50-55% with moderate LVH. There was a trileaflet aortic valve with trivial AI, trivial MR. Cardiac cath showed a 90% proximal RCA stenosis. The proximal LAD had 30% narrowing within the previously placed stent. The LCX had 40% proximal stenosis. He has occasional chest pain with exertion.   Past Medical History  Diagnosis Date  .  Arteriosclerotic cardiovascular disease (ASCVD) 2006    Stents to unknown arteries by cardiologist at [facility] ; echo in 2006-normal EF, moderate LVH, mild inferior hypokinesis  . Hypertension   . 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 Hypothyroidism   . Atrial flutter (HCC) 2012    Left atrial appendage thrombus with SEC; converted on amiodarone to marked SB  . Cardiomyopathy 2012    CHF in 10/2010; possibly tachycardia mediated; EF of 20-25%  . Chronic anticoagulation 2012    Discontinued  . Tobacco abuse     40 pack years; Tapering use; 10/2012:1/5 pack per day  . Gout   . Benign prostatic hypertrophy   . Nephrolithiasis     Lithotripsy; left ureteral calculus in 10/2012  . Stroke (HCC)   . Gout   . Anxiety   . Major depressive disorder (HCC)   . CHF (congestive heart failure) (HCC)   . Hyperlipidemia   . Peripheral vascular disease (HCC)   . Atrial fibrillation (HCC)   . GERD (gastroesophageal reflux disease)   . Muscle weakness     Past Surgical History  Procedure Laterality Date  . Lithotripsy    . Cardioversion    . Coronary stent placement  2003    Family History  Problem Relation Age of Onset  . Dementia Mother   . Dementia Father     Social History Social History  Substance Use Topics  .  Smoking status: Former Smoker -- 0.50 packs/day for 40 years    Types: Cigarettes    Quit date: 12/04/2012  . Smokeless tobacco: Never Used     Comment: 10/2012:1/5 pack per day  . Alcohol Use: No    Current Outpatient Prescriptions  Medication Sig Dispense Refill  . allopurinol (ZYLOPRIM) 100 MG tablet Take 100 mg by mouth daily.    Marland Kitchen amiodarone (PACERONE) 200 MG tablet TAKE ONE TABLET BY MOUTH EVERY DAY 30 tablet 6  . amLODipine (NORVASC) 5 MG tablet Take 5 mg by mouth  daily.    Marland Kitchen aspirin 81 MG tablet Take 81 mg by mouth daily.     . Aspirin-Acetaminophen-Caffeine (EXCEDRIN PO) Take by mouth.    Marland Kitchen atorvastatin (LIPITOR) 40 MG tablet Take 40 mg by mouth daily.     . Bismuth Subsalicylate (PEPTO-BISMOL PO) Take by mouth.    . Cholecalciferol 5000 UNITS TABS Take 1 tablet by mouth every 30 (thirty) days.    . Dextromethorphan-Guaifenesin (TUSSIN DM PO) Take by mouth.    . DM-Doxylamine-Acetaminophen (NITE TIME COLD/FLU RELIEF PO) Take by mouth.    . furosemide (LASIX) 20 MG tablet Take 20 mg by mouth daily.    Marland Kitchen LEVOTHYROXINE SODIUM PO Take 225 mcg by mouth daily.    Marland Kitchen lisinopril (PRINIVIL,ZESTRIL) 20 MG tablet Take 1 tablet (20 mg total) by mouth daily. Increase in dose 30 tablet 3  . magnesium hydroxide (MILK OF MAGNESIA) 400 MG/5ML suspension Take 30 mLs by mouth daily as needed for mild constipation.    . metoprolol succinate (TOPROL-XL) 25 MG 24 hr tablet Take 50 mg by mouth daily.    . nitroGLYCERIN (NITROSTAT) 0.4 MG SL tablet Place 0.4 mg under the tongue every 5 (five) minutes as needed.     . Olopatadine HCl 0.2 % SOLN Apply 1 drop to eye.    . Omega-3 Fatty Acids (SUPER OMEGA-EPA) 1000 MG CAPS Take 2 capsules by mouth daily.    Marland Kitchen omeprazole (PRILOSEC) 20 MG capsule Take 20 mg by mouth daily.    . rivaroxaban (XARELTO) 20 MG TABS tablet Take 20 mg by mouth daily with supper.    . senna (SENOKOT) 8.6 MG tablet Take 1 tablet by mouth 2 (two) times daily.    . solifenacin (VESICARE) 5 MG tablet Take 10 mg by mouth daily.     . tamsulosin (FLOMAX) 0.4 MG CAPS Take by mouth daily.     No current facility-administered medications for this visit.    Allergies  Allergen Reactions  . Penicillins     Review of Systems  Constitutional: Negative for activity change, appetite change and fatigue.   Has gained weight. Non-compliant with  diet and frequently eats fast food.  HENT: Negative.  Eyes: Negative.  Respiratory: Positive for shortness of breath.  Cardiovascular: Positive for palpitations and leg swelling. Negative for chest pain.   Orthopnea  Endocrine: Negative.  Genitourinary: Negative.  Allergic/Immunologic: Negative.  Neurological: Negative.  Hematological: Negative.  Psychiatric/Behavioral:   Chronic decreased mental capacity and inability to care for himself    BP 183/100 mmHg  Pulse 60  Resp 20  Ht 6\' 2"  (1.88 m)  Wt 252 lb (114.306 kg)  BMI 32.34 kg/m2  SpO2 96% Physical Exam  Constitutional: He is oriented to person, place, and time.  Obese gentleman in no distress  HENT:  Head: Normocephalic and atraumatic.  Mouth/Throat: Oropharynx is clear and moist.  Eyes: EOM are normal. Pupils are equal, round, and reactive  to light.  Neck: Normal range of motion. Neck supple. No JVD present. No thyromegaly present.  Cardiovascular: Normal rate, regular rhythm and normal heart sounds. Exam reveals no gallop and no friction rub.  No murmur heard. Pulmonary/Chest: Effort normal and breath sounds normal. No respiratory distress. He has no rales.  Abdominal: Soft. Bowel sounds are normal. He exhibits no distension and no mass. There is no tenderness.  Musculoskeletal: Normal range of motion. He exhibits edema.  Lymphadenopathy:   He has no cervical adenopathy.  Neurological: He is alert and oriented to person, place, and time. No cranial nerve deficit.  Left arm and leg slightly weaker than right.  Skin: Skin is warm and dry.  Psychiatric: He has a normal mood and affect.  Answers questions slowly with simple answers    Diagnostic Tests:  CTA of the chest from 08/30/2015 at [facility] reviewed on PACS.     *[facility]*  [address], [address]  [place], [place] [address]   [phone]  ------------------------------------------------------------------- Transthoracic Echocardiography  Patient: Sean, Hardy MR #: [id number] Study Date: 10/31/2015 Gender: M Age: 10 Height: 185.4 cm Weight: 113.4 kg BSA: 2.45 m^2 Pt. Status: Room:  ATTENDING Nona Dell, M.D. Lisette Abu, M.D. REFERRING Patrick Jupiter, M.D. PERFORMING [facility] SONOGRAPHER Verlene Mayer, RCS  cc:  ------------------------------------------------------------------- LV EF: 50% - 55%  ------------------------------------------------------------------- History: PMH: Atrial flutter. Coronary artery disease. Cardiomyopathy of unknown etiology. Stroke. Risk factors: Former tobacco use. Hypertension. Obese.  ------------------------------------------------------------------- Study Conclusions  - Left ventricle: The cavity size was normal. Estis thickness was  increased in a pattern of moderate LVH. Systolic function was  normal. The estimated ejection fraction was in the range of 50%  to 55%. Diffuse hypokinesis. Left ventricular diastolic function  parameters were normal for the patient&'s age. - Aortic valve: There was trivial regurgitation. - Aorta: Aortic root dimension: 51 mm (ED). - Aortic root: The aortic root was moderately dilated. - Mitral valve: There was trivial regurgitation. - Left atrium: The atrium was moderately to severely dilated. - Right atrium: Central venous pressure (est): 3 mm Hg. - Tricuspid valve: There was trivial regurgitation. - Pulmonary arteries: Systolic pressure could not be accurately  estimated. - Pericardium, extracardiac: There was no pericardial effusion.  Impressions:  - Moderate LVH with LVEF 50-55%, grossly normal diastolic function.  Moderate to severe left atrial enlargement. Trivial mitral  regurgitation. Moderately dilated aortic root  measuring 51 mm on  this particular study. Trivial tricuspid regurgitation.  Transthoracic echocardiography. M-mode, complete 2D, spectral Doppler, and color Doppler. Birthdate: Patient birthdate: 04/20/1951. Age: Patient is 65 yr old. Sex: Gender: male. BMI: 33 kg/m^2. Blood pressure: 116/70 Patient status: Outpatient. Study date: Study date: 10/31/2015. Study time: 02:26 PM.  -------------------------------------------------------------------  ------------------------------------------------------------------- Left ventricle: The cavity size was normal. Weekly thickness was increased in a pattern of moderate LVH. Systolic function was normal. The estimated ejection fraction was in the range of 50% to 55%. Diffuse hypokinesis. Left ventricular diastolic function parameters were normal for the patient&'s age.  ------------------------------------------------------------------- Aortic valve: Probably trileaflet. Doppler: There was trivial regurgitation.  ------------------------------------------------------------------- Aorta: Aortic root: The aortic root was moderately dilated.  ------------------------------------------------------------------- Mitral valve: The valve appears to be grossly normal. Doppler: There was trivial regurgitation.  ------------------------------------------------------------------- Left atrium: The atrium was moderately to severely dilated.  ------------------------------------------------------------------- Right ventricle: The cavity size was normal. Systolic function was normal.  ------------------------------------------------------------------- Pulmonic valve: The valve appears to be grossly normal. Doppler: There was physiologic regurgitation.  -------------------------------------------------------------------  Tricuspid valve: The valve appears to be grossly normal. Doppler: There was trivial  regurgitation.  ------------------------------------------------------------------- Pulmonary artery: Systolic pressure could not be accurately estimated.  ------------------------------------------------------------------- Right atrium: The atrium was normal in size.  ------------------------------------------------------------------- Pericardium: There was no pericardial effusion.  ------------------------------------------------------------------- Systemic veins: Inferior vena cava: The vessel was normal in size. The respirophasic diameter changes were in the normal range (= 50%), consistent with normal central venous pressure.  ------------------------------------------------------------------- Measurements  Left ventricle Value Reference LV ID, ED, PLAX chordal (H) 53.4 mm 43 - 52 LV ID, ES, PLAX chordal (H) 43.9 mm 23 - 38 LV fx shortening, PLAX chordal (L) 18 % >=29 LV PW thickness, ED 15.4 mm --------- IVS/LV PW ratio, ED 0.94 <=1.3 Stroke volume, 2D 100 ml --------- Stroke volume/bsa, 2D 41 ml/m^2 --------- LV ejection fraction, 1-p A4C 62 % --------- LV end-diastolic volume, 2-p 76 ml --------- LV end-systolic volume, 2-p 30 ml --------- LV ejection fraction, 2-p 60 % --------- Stroke volume, 2-p 45 ml --------- LV end-diastolic volume/bsa, 2-p 31 ml/m^2 --------- LV end-systolic volume/bsa, 2-p 12 ml/m^2 --------- Stroke volume/bsa, 2-p 18.5 ml/m^2 --------- LV e&', lateral 7.45 cm/s --------- LV E/e&', lateral 6.71 --------- LV e&',  medial 5.9 cm/s --------- LV E/e&', medial 8.47 --------- LV e&', average 6.68 cm/s --------- LV E/e&', average 7.49 ---------  Ventricular septum Value Reference IVS thickness, ED 14.4 mm ---------  LVOT Value Reference LVOT ID, S 30 mm --------- LVOT area 7.07 cm^2 --------- LVOT ID 30 mm --------- LVOT peak velocity, S 70.2 cm/s --------- LVOT mean velocity, S 48.2 cm/s ---------  Aorta Value Reference Aortic root ID, ED 51 mm ---------  Left atrium Value Reference LA ID, A-P, ES 50 mm --------- LA ID/bsa, A-P 2.04 cm/m^2 <=2.2 LA volume, S 126 ml --------- LA volume/bsa, S 51.4 ml/m^2 --------- LA volume, ES, 1-p A4C 141 ml --------- LA volume/bsa, ES, 1-p A4C 57.6 ml/m^2 --------- LA volume, ES, 1-p A2C 92 ml --------- LA volume/bsa, ES, 1-p A2C 37.6 ml/m^2 ---------  Mitral valve Value Reference Mitral E-wave peak velocity 50 cm/s --------- Mitral deceleration time (L) 140 ms 150 - 230  Systemic veins Value Reference Estimated CVP 3 mm Hg ---------  Right ventricle Value  Reference TAPSE 26.3 mm --------- RV s&', lateral, S 10 cm/s ---------  Pulmonic valve Value Reference Pulmonic valve peak velocity, S 68.4 cm/s ---------  Legend: (L) and (H) mark values outside specified reference range.  ------------------------------------------------------------------- Prepared and Electronically Authenticated by  Nona Dell, M.D. 2017-06-07T15:33:21    Lennette Bihari, MD (Primary)      Procedures    Right/Left Heart Cath and Coronary Angiography    Conclusion     Mid LAD lesion, 30% stenosed. The lesion was previously treated with a stent (unknown type).  Prox Cx to Mid Cx lesion, 40% stenosed.  Ost RCA lesion, 30% stenosed.  Prox RCA lesion, 90% stenosed.  Mid RCA lesion, 50% stenosed.  There is mild to moderate left ventricular systolic dysfunction.  Markedly dilated thoracic aortic root and ascending aorta.  Trivial aortic insufficiency.  Mild pulmonary hypertension with mild elevation of right-sided heart pressures.  Mild - moderate LV dysfunction with diffuse 40-45% ejection fraction with more pronounced hypocontractility in the basal inferior [place]  Coronary calcification of the LAD with 30% narrowing in the proximal portion of the previously placed stent arising after the takeoff of the first diagonal in the region of the first septal perforating artery.  Moderate size left circumflex coronary artery with  40% proximal stenosis.  Calcified RCA with 30% ostial stenosis, diffuse 90% proximal stenosis and 50% mid stenosis.  RECOMMENDATION: The patient will be referred back to Dr. Evelene Croon for surgery of his ascending aortic aneurysm and need for CABG to his RCA.     Indications    Aortic root aneurysm (HCC) [I71.9 (ICD-10-CM)]   CAD in native artery [I25.10 (ICD-10-CM)]     Technique and Indications    [name] is a 65 year old Caucasian male who underwent PCI with stenting to his LAD in 2003, at [facility]. The patient has been recently found to have a markedly dilated food. A CT scan has measured this at 5.8 cm. He has been evaluated by Dr. Laneta Simmers. The patient recently was admitted to [facility] with chest pain. The patient is referred by Dr. Wyline Mood for right and left heart catheterization.  The patient was brought to the second floor [place] [facility] cath lab in the postabsorptive state. Versed 2 mg and fentanyl 25 mcg were initially administered for conscious sedation. The right groin was prepped and draped in sterile fashion and a 5 [place] arterial sheath and 7 French venous sheath were inserted without difficulty. A Swan-Ganz catheter was advanced into the venous sheath and pressures were obtained in the right atrium, right ventricle, pulmonary artery, and pulmonary capillary wedge position. Cardiac outputs were obtained by the thermodilution and assumed Fick methods. Oxygen saturation was obtained in the pulmonary artery and aorta. A pigtail catheter was inserted and simultaneous AO/PA pressures were recorded. The pigtail catheter was advanced into the left ventricle and simultaneous left ventricular and PCW pressures were recorded. Left ventriculography was performed in the RAO projection. A left ventricle to aorta pullback was performed. The pigtail catheter was then removed and diagnostic catheterization to delineate the coronary anatomy was performed utilizing 5 French Judkins 6 left for the left coronary system. Due to a superior and anterior takeoff in a markedly dilated aorta. Selective angiography into the RCA ostium was extremely difficult. Multiple catheters were utilized including a JR4, no torque right, a L2, AR-2, and ultimately, a Medtronic 3-D right guide Launcher catheter was successful to selectively cannulate the RCA. All catheters were  removed and the patient. Hemostasis was obtained by direct manual pressure. The patient tolerated the procedure well and returned to his room in satisfactory condition.  During this procedure the patient was administered a total of Versed 3 mg and Fentanyl 50 mg to achieve and maintain moderate conscious sedation. The patient's heart rate, blood pressure, and oxygen saturation were monitored continuously during the procedure. The period of conscious sedation was 59 minutes, of which I was present face-to-face 100% of this time. Estimated blood loss <50 mL. There were no immediate complications during the procedure.    Coronary Findings    Dominance: Right   Left Main  The left main was a short vessel which bifurcated into the LAD and left circumflex vessel.     Left Anterior Descending  There was calcification in the proximal LAD. A stent was present which arose just after the proximal diagonal vessel. There was mild 30% narrowing in the region of the septal perforating artery at the beginning of the stented segment. The LAD gave rise to an additional diagonal vessel was free of significant disease.   . Mid LAD lesion, 30% stenosed. The lesion was previously treated with a stent (unknown type).     Left Circumflex  The left circumflex vessel had a 40% proximal stenosis.   Marland Kitchen  Prox Cx to Mid Cx lesion, 40% stenosed.     Right Coronary Artery  The RCA had proximal calcification and a superior anterior takeoff. With the markedly dilated aortic root, cannulation of the RCA was difficult but ultimately successful with a 3D right launcher guide catheter.   Suezanne Jacquet. Ost RCA lesion, 30% stenosed.   . Prox RCA lesion, 90% stenosed.   . Mid RCA lesion, 50% stenosed.       Right Heart Pressures Hemodynamic findings consistent with mild pulmonary hypertension. LV EDP is normal. RA: 10/8; 7 RV: 37/9 PA: 41/12 PW: 15  AO: 160/81 PA: 33/12  LV: 163/15 PW:  17/15; mean 12  LV: 166/13 AO: 166/82  Cardiac output: 5.4 liters/minute (thermodilution); 6.3 (Fick) Cardiac Index: 2.3 liters/minute/meter squared; 2.7    Evangelist Motion                 Left Heart    Left Ventricle The left ventricle is enlarged. There is mild to moderate left ventricular systolic dysfunction. There is mild-to-moderate LV dysfunction with an ejection fraction of approximately 40-45%. There appears to be a focal area of mid to basal inferior hypocontractility.   Aortic Valve , and trivial (1+) aortic regurgitation.   Aorta The ascending aorta is severely dilated. The aorta is aneurysmal. Markedly enlarged and aneurysmal aortic root and ascending thoracic aortic aneurysm which previously had been documented to be 5.8 cm on CT evaluation.    Coronary Diagrams    Diagnostic Diagram            Implants    No implant documentation for this case.    PACS Images    Show images for Cardiac catheterization     Link to Procedure Log    Procedure Log      Hemo Data       Most Recent Value   Fick Cardiac Output  6.28 L/min   Fick Cardiac Output Index  2.71 (L/min)/BSA   Thermal Cardiac Output  5.43 L/min   Thermal Cardiac Output Index  2.34 (L/min)/BSA   RA A Wave  10 mmHg   RA V Wave  8 mmHg   RA Mean  7 mmHg   RV Systolic Pressure  37 mmHg   RV Diastolic Pressure  2 mmHg   RV EDP  9 mmHg   PA Systolic Pressure  33 mmHg   PA Diastolic Pressure  12 mmHg   PA Mean  21 mmHg   PW A Wave  18 mmHg   PW V Wave  13 mmHg   PW Mean  11 mmHg   AO Systolic Pressure  154 mmHg   AO Diastolic Pressure  76 mmHg   AO Mean  106 mmHg   LV Systolic Pressure  162 mmHg   LV Diastolic Pressure  8 mmHg   LV EDP  13 mmHg   Arterial Occlusion Pressure Extended Systolic Pressure  166 mmHg    Arterial Occlusion Pressure Extended Diastolic Pressure  82 mmHg   Arterial Occlusion Pressure Extended Mean Pressure  112 mmHg   Left Ventricular Apex Extended Systolic Pressure  166 mmHg   Left Ventricular Apex Extended Diastolic Pressure  9 mmHg   Left Ventricular Apex Extended EDP Pressure  13 mmHg   TPVR Index  9.82 HRUI   TSVR Index  46.58 HRUI   PVR SVR Ratio  0.13   TPVR/TSVR Ratio  0.21         Impression:  He has a 5.8 cm  aortic root aneurysm and severe RCA stenosis. His aortic valve is difficult to see in detail on 2D echo due to his body size but there is only trivial AI and I think a valve sparing root replacement may be possible. If if is not then I would use a pericardial valved conduit. He would also need a bypass to the distal RCA and with his history of atrial fib/flutter and prior stroke I would clip the LAA. I reviewed the studies with the patient and his guardian and all questions were answered. I discussed the operative procedure with the patient and his guardian from social services including alternatives, benefits and risks; including but not limited to bleeding, blood transfusion, infection, stroke, myocardial infarction, graft failure, heart block requiring a permanent pacemaker, organ dysfunction, and death. Sean Hardy understands and agrees to proceed.   Plan:  Valve sparing aortic root replacement if possible and CABG to the RCA, clipping of the LAA on 12-30-15. If the aortic valve is not suitable for sparing then I would do a biological Bentall procedure.   Alleen Borne, MD [facility] [phone]

## 2015-12-09 NOTE — Anesthesia Preprocedure Evaluation (Addendum)
Anesthesia Evaluation  Patient identified by MRN, date of birth, ID band Patient awake    Reviewed: Allergy & Precautions, NPO status , Patient's Chart, lab work & pertinent test results  Airway Mallampati: II  TM Distance: >3 FB Neck ROM: Full    Dental no notable dental hx.    Pulmonary former smoker,    Pulmonary exam normal breath sounds clear to auscultation + decreased breath sounds      Cardiovascular hypertension, + CAD, + Peripheral Vascular Disease and +CHF  + dysrhythmias Atrial Fibrillation  Rhythm:Regular Rate:Normal + Systolic murmurs RHC/LHC 11/12/15: Conclusion  Mid LAD lesion, 30% stenosed. The lesion was previously treated with a stent (unknown type). Prox Cx to Mid Cx lesion, 40% stenosed. Ost RCA lesion, 30% stenosed. Prox RCA lesion, 90% stenosed. Mid RCA lesion, 50% stenosed. There is mild to moderate left ventricular systolic dysfunction  TTE 6/17: Study Conclusions  - Left ventricle: The cavity size was normal. [place] thickness was increased in a pattern of moderate LVH. Systolic function was normal. The estimated ejection fraction was in the range of 50% to 55%. Diffuse hypokinesis. Left ventricular diastolic function parameters were normal for the patient&'s age. - Aortic valve: There was trivial regurgitation. - Aorta: Aortic root dimension: 51 mm (ED). - Aortic root: The aortic root was moderately dilated. - Mitral valve: There was trivial regurgitation. - Left atrium: The atrium was moderately to severely dilated. - Right atrium: Central venous pressure (est): 3 mm Hg. - Tricuspid valve: There was trivial regurgitation. - Pulmonary arteries: Systolic pressure could not be accurately estimated. - Pericardium, extracardiac: There was no pericardial effusion.  Impressions:  - Moderate LVH with LVEF 50-55%, grossly normal diastolic function. Moderate to severe left atrial enlargement. Trivial  mitral regurgitation. Moderately dilated aortic root measuring 51 mm on this particular study. Trivial tricuspid regurgitation.   Neuro/Psych Anxiety CVA, Residual Symptoms    GI/Hepatic negative GI ROS, Neg liver ROS,   Endo/Other  Hypothyroidism   Renal/GU negative Renal ROS  negative genitourinary   Musculoskeletal negative musculoskeletal ROS (+)   Abdominal   Peds negative pediatric ROS (+)  Hematology negative hematology ROS (+)   Anesthesia Other Findings   Reproductive/Obstetrics negative OB ROS                          Anesthesia Physical Anesthesia Plan  ASA: IV  Anesthesia Plan: General   Post-op Pain Management:    Induction: Intravenous  Airway Management Planned: Oral ETT  Additional Equipment: Arterial line, TEE, PA Cath and Ultrasound Guidance Line Placement  Intra-op Plan:   Post-operative Plan: Post-operative intubation/ventilation  Informed Consent: I have reviewed the patients History and Physical, chart, labs and discussed the procedure including the risks, benefits and alternatives for the proposed anesthesia with the patient or authorized representative who has indicated his/her understanding and acceptance.   Dental advisory given  Plan Discussed with: CRNA and Surgeon  Anesthesia Plan Comments:         Anesthesia Quick Evaluation

## 2015-12-10 ENCOUNTER — Encounter (HOSPITAL_COMMUNITY): Payer: Self-pay | Admitting: Anesthesiology

## 2015-12-10 ENCOUNTER — Encounter (HOSPITAL_COMMUNITY): Admission: RE | Disposition: E | Payer: Self-pay | Source: Ambulatory Visit | Attending: Surgery

## 2015-12-10 ENCOUNTER — Inpatient Hospital Stay (HOSPITAL_COMMUNITY)
Admission: RE | Admit: 2015-12-10 | Discharge: 2015-12-25 | DRG: 219 | Disposition: E | Payer: Medicare Other | Source: Ambulatory Visit | Attending: Surgery | Admitting: Surgery

## 2015-12-10 ENCOUNTER — Inpatient Hospital Stay (HOSPITAL_COMMUNITY): Payer: Medicare Other

## 2015-12-10 DIAGNOSIS — I2584 Coronary atherosclerosis due to calcified coronary lesion: Secondary | ICD-10-CM | POA: Diagnosis present

## 2015-12-10 DIAGNOSIS — N179 Acute kidney failure, unspecified: Secondary | ICD-10-CM | POA: Diagnosis not present

## 2015-12-10 DIAGNOSIS — I110 Hypertensive heart disease with heart failure: Secondary | ICD-10-CM | POA: Diagnosis present

## 2015-12-10 DIAGNOSIS — Z7901 Long term (current) use of anticoagulants: Secondary | ICD-10-CM

## 2015-12-10 DIAGNOSIS — E872 Acidosis: Secondary | ICD-10-CM | POA: Diagnosis not present

## 2015-12-10 DIAGNOSIS — A419 Sepsis, unspecified organism: Secondary | ICD-10-CM | POA: Diagnosis not present

## 2015-12-10 DIAGNOSIS — I97418 Intraoperative hemorrhage and hematoma of a circulatory system organ or structure complicating other circulatory system procedure: Secondary | ICD-10-CM | POA: Diagnosis not present

## 2015-12-10 DIAGNOSIS — I4891 Unspecified atrial fibrillation: Secondary | ICD-10-CM | POA: Diagnosis present

## 2015-12-10 DIAGNOSIS — I4892 Unspecified atrial flutter: Secondary | ICD-10-CM | POA: Diagnosis present

## 2015-12-10 DIAGNOSIS — M109 Gout, unspecified: Secondary | ICD-10-CM | POA: Diagnosis present

## 2015-12-10 DIAGNOSIS — R34 Anuria and oliguria: Secondary | ICD-10-CM | POA: Diagnosis not present

## 2015-12-10 DIAGNOSIS — R570 Cardiogenic shock: Secondary | ICD-10-CM | POA: Diagnosis not present

## 2015-12-10 DIAGNOSIS — Z6841 Body Mass Index (BMI) 40.0 and over, adult: Secondary | ICD-10-CM

## 2015-12-10 DIAGNOSIS — J80 Acute respiratory distress syndrome: Secondary | ICD-10-CM | POA: Diagnosis not present

## 2015-12-10 DIAGNOSIS — I255 Ischemic cardiomyopathy: Secondary | ICD-10-CM | POA: Diagnosis not present

## 2015-12-10 DIAGNOSIS — N400 Enlarged prostate without lower urinary tract symptoms: Secondary | ICD-10-CM | POA: Diagnosis present

## 2015-12-10 DIAGNOSIS — E669 Obesity, unspecified: Secondary | ICD-10-CM | POA: Diagnosis present

## 2015-12-10 DIAGNOSIS — S2501XA Minor laceration of thoracic aorta, initial encounter: Secondary | ICD-10-CM | POA: Diagnosis not present

## 2015-12-10 DIAGNOSIS — Z961 Presence of intraocular lens: Secondary | ICD-10-CM | POA: Diagnosis present

## 2015-12-10 DIAGNOSIS — Z951 Presence of aortocoronary bypass graft: Secondary | ICD-10-CM

## 2015-12-10 DIAGNOSIS — F329 Major depressive disorder, single episode, unspecified: Secondary | ICD-10-CM | POA: Diagnosis present

## 2015-12-10 DIAGNOSIS — Z7982 Long term (current) use of aspirin: Secondary | ICD-10-CM

## 2015-12-10 DIAGNOSIS — I69354 Hemiplegia and hemiparesis following cerebral infarction affecting left non-dominant side: Secondary | ICD-10-CM | POA: Diagnosis not present

## 2015-12-10 DIAGNOSIS — I712 Thoracic aortic aneurysm, without rupture: Principal | ICD-10-CM | POA: Diagnosis present

## 2015-12-10 DIAGNOSIS — I714 Abdominal aortic aneurysm, without rupture: Secondary | ICD-10-CM | POA: Diagnosis not present

## 2015-12-10 DIAGNOSIS — D696 Thrombocytopenia, unspecified: Secondary | ICD-10-CM | POA: Diagnosis not present

## 2015-12-10 DIAGNOSIS — Z87891 Personal history of nicotine dependence: Secondary | ICD-10-CM

## 2015-12-10 DIAGNOSIS — F419 Anxiety disorder, unspecified: Secondary | ICD-10-CM | POA: Diagnosis present

## 2015-12-10 DIAGNOSIS — I7121 Aneurysm of the ascending aorta, without rupture: Secondary | ICD-10-CM

## 2015-12-10 DIAGNOSIS — I509 Heart failure, unspecified: Secondary | ICD-10-CM | POA: Diagnosis present

## 2015-12-10 DIAGNOSIS — Z955 Presence of coronary angioplasty implant and graft: Secondary | ICD-10-CM

## 2015-12-10 DIAGNOSIS — Z9111 Patient's noncompliance with dietary regimen: Secondary | ICD-10-CM

## 2015-12-10 DIAGNOSIS — E785 Hyperlipidemia, unspecified: Secondary | ICD-10-CM | POA: Diagnosis present

## 2015-12-10 DIAGNOSIS — J9811 Atelectasis: Secondary | ICD-10-CM | POA: Diagnosis not present

## 2015-12-10 DIAGNOSIS — J449 Chronic obstructive pulmonary disease, unspecified: Secondary | ICD-10-CM | POA: Diagnosis present

## 2015-12-10 DIAGNOSIS — I2510 Atherosclerotic heart disease of native coronary artery without angina pectoris: Secondary | ICD-10-CM | POA: Diagnosis present

## 2015-12-10 DIAGNOSIS — I454 Nonspecific intraventricular block: Secondary | ICD-10-CM | POA: Diagnosis present

## 2015-12-10 DIAGNOSIS — J95821 Acute postprocedural respiratory failure: Secondary | ICD-10-CM | POA: Diagnosis not present

## 2015-12-10 DIAGNOSIS — Z66 Do not resuscitate: Secondary | ICD-10-CM | POA: Diagnosis not present

## 2015-12-10 DIAGNOSIS — I719 Aortic aneurysm of unspecified site, without rupture: Secondary | ICD-10-CM | POA: Diagnosis present

## 2015-12-10 DIAGNOSIS — Z79899 Other long term (current) drug therapy: Secondary | ICD-10-CM

## 2015-12-10 DIAGNOSIS — Z9841 Cataract extraction status, right eye: Secondary | ICD-10-CM

## 2015-12-10 DIAGNOSIS — E039 Hypothyroidism, unspecified: Secondary | ICD-10-CM | POA: Diagnosis present

## 2015-12-10 DIAGNOSIS — I272 Other secondary pulmonary hypertension: Secondary | ICD-10-CM | POA: Diagnosis present

## 2015-12-10 DIAGNOSIS — K219 Gastro-esophageal reflux disease without esophagitis: Secondary | ICD-10-CM | POA: Diagnosis present

## 2015-12-10 DIAGNOSIS — Z9842 Cataract extraction status, left eye: Secondary | ICD-10-CM

## 2015-12-10 DIAGNOSIS — I739 Peripheral vascular disease, unspecified: Secondary | ICD-10-CM | POA: Diagnosis present

## 2015-12-10 DIAGNOSIS — I711 Thoracic aortic aneurysm, ruptured: Secondary | ICD-10-CM | POA: Diagnosis not present

## 2015-12-10 HISTORY — DX: Unspecified osteoarthritis, unspecified site: M19.90

## 2015-12-10 HISTORY — DX: Aortic aneurysm of unspecified site, without rupture: I71.9

## 2015-12-10 HISTORY — DX: Reserved for inherently not codable concepts without codable children: IMO0001

## 2015-12-10 LAB — APTT: aPTT: 77 seconds — ABNORMAL HIGH (ref 24–37)

## 2015-12-10 LAB — HEPARIN LEVEL (UNFRACTIONATED): Heparin Unfractionated: 0.41 IU/mL (ref 0.30–0.70)

## 2015-12-10 LAB — PROTIME-INR
INR: 1.14 (ref 0.00–1.49)
Prothrombin Time: 14.8 seconds (ref 11.6–15.2)

## 2015-12-10 SURGERY — ASCENDING AORTIC ROOT REPLACEMENT
Anesthesia: General | Site: Chest

## 2015-12-10 MED ORDER — FENTANYL CITRATE (PF) 100 MCG/2ML IJ SOLN
INTRAMUSCULAR | Status: AC | PRN
  Administered 2015-12-10: 50 ug via INTRAVENOUS
  Administered 2015-12-13: 250 ug via INTRAVENOUS
  Administered 2015-12-13 (×2): 100 ug via INTRAVENOUS
  Administered 2015-12-13 (×4): 250 ug via INTRAVENOUS
  Administered 2015-12-13: 100 ug via INTRAVENOUS
  Administered 2015-12-13: 50 ug via INTRAVENOUS

## 2015-12-10 MED ORDER — MIDAZOLAM HCL 5 MG/5ML IJ SOLN
INTRAMUSCULAR | Status: AC | PRN
  Administered 2015-12-10: 1 mg via INTRAVENOUS
  Administered 2015-12-13 (×2): 2 mg via INTRAVENOUS
  Administered 2015-12-13 (×2): 4 mg via INTRAVENOUS

## 2015-12-10 MED ORDER — LIDOCAINE 2% (20 MG/ML) 5 ML SYRINGE
INTRAMUSCULAR | Status: AC
  Filled 2015-12-10: qty 5

## 2015-12-10 MED ORDER — ROCURONIUM BROMIDE 50 MG/5ML IV SOLN
INTRAVENOUS | Status: AC
  Filled 2015-12-10: qty 1

## 2015-12-10 MED ORDER — METOPROLOL TARTRATE 50 MG PO TABS
50.0000 mg | ORAL_TABLET | Freq: Two times a day (BID) | ORAL | Status: DC
  Administered 2015-12-10 – 2015-12-12 (×4): 50 mg via ORAL
  Filled 2015-12-10 (×5): qty 1

## 2015-12-10 MED ORDER — FUROSEMIDE 40 MG PO TABS
40.0000 mg | ORAL_TABLET | Freq: Every day | ORAL | Status: DC
  Administered 2015-12-10 – 2015-12-12 (×3): 40 mg via ORAL
  Filled 2015-12-10 (×3): qty 1

## 2015-12-10 MED ORDER — FENTANYL CITRATE (PF) 250 MCG/5ML IJ SOLN
INTRAMUSCULAR | Status: AC
  Filled 2015-12-10: qty 25

## 2015-12-10 MED ORDER — PROPOFOL 10 MG/ML IV BOLUS
INTRAVENOUS | Status: AC
  Filled 2015-12-10: qty 20

## 2015-12-10 MED ORDER — ACETAMINOPHEN 325 MG PO TABS: 650.0000 mg | ORAL_TABLET | Freq: Four times a day (QID) | ORAL | Status: DC | PRN

## 2015-12-10 MED ORDER — AMIODARONE HCL 200 MG PO TABS
200.0000 mg | ORAL_TABLET | Freq: Every day | ORAL | Status: DC
  Administered 2015-12-11 – 2015-12-12 (×2): 200 mg via ORAL
  Filled 2015-12-10 (×2): qty 1

## 2015-12-10 MED ORDER — PANTOPRAZOLE SODIUM 40 MG PO TBEC
40.0000 mg | DELAYED_RELEASE_TABLET | Freq: Every day | ORAL | Status: DC
  Administered 2015-12-11 – 2015-12-12 (×2): 40 mg via ORAL
  Filled 2015-12-10 (×2): qty 1

## 2015-12-10 MED ORDER — AMLODIPINE BESYLATE 5 MG PO TABS
5.0000 mg | ORAL_TABLET | Freq: Every day | ORAL | Status: DC
  Administered 2015-12-11 – 2015-12-12 (×2): 5 mg via ORAL
  Filled 2015-12-10 (×2): qty 1

## 2015-12-10 MED ORDER — ATORVASTATIN CALCIUM 40 MG PO TABS
40.0000 mg | ORAL_TABLET | Freq: Every day | ORAL | Status: DC
Start: 2015-12-10 — End: 2015-12-15
  Administered 2015-12-10 – 2015-12-14 (×4): 40 mg via ORAL
  Filled 2015-12-10 (×4): qty 1

## 2015-12-10 MED ORDER — MIDAZOLAM HCL 10 MG/2ML IJ SOLN
INTRAMUSCULAR | Status: AC
  Filled 2015-12-10: qty 2

## 2015-12-10 MED ORDER — HEPARIN (PORCINE) IN NACL 100-0.45 UNIT/ML-% IJ SOLN
1450.0000 [IU]/h | INTRAMUSCULAR | Status: DC
  Administered 2015-12-10 – 2015-12-12 (×4): 1450 [IU]/h via INTRAVENOUS
  Filled 2015-12-10 (×5): qty 250

## 2015-12-10 MED ORDER — METOPROLOL TARTRATE 50 MG PO TABS: 50.0000 mg | ORAL_TABLET | Freq: Two times a day (BID) | ORAL | Status: DC

## 2015-12-10 MED ORDER — OLOPATADINE HCL 0.1 % OP SOLN
1.0000 [drp] | Freq: Two times a day (BID) | OPHTHALMIC | Status: DC
  Administered 2015-12-10 – 2015-12-15 (×10): 1 [drp] via OPHTHALMIC
  Filled 2015-12-10 (×2): qty 5

## 2015-12-10 MED ORDER — LISINOPRIL 5 MG PO TABS
5.0000 mg | ORAL_TABLET | Freq: Every day | ORAL | Status: DC
  Administered 2015-12-10 – 2015-12-12 (×3): 5 mg via ORAL
  Filled 2015-12-10 (×3): qty 1

## 2015-12-10 MED ORDER — TRAMADOL HCL 50 MG PO TABS
50.0000 mg | ORAL_TABLET | Freq: Three times a day (TID) | ORAL | Status: DC | PRN
  Administered 2015-12-12: 50 mg via ORAL
  Filled 2015-12-10 (×2): qty 1

## 2015-12-10 MED ORDER — POLYETHYLENE GLYCOL 3350 17 G PO PACK
17.0000 g | PACK | ORAL | Status: DC
  Administered 2015-12-10 – 2015-12-12 (×2): 17 g via ORAL
  Filled 2015-12-10 (×2): qty 1

## 2015-12-10 MED ORDER — CHLORHEXIDINE GLUCONATE 4 % EX LIQD: 30.0000 mL | CUTANEOUS | Status: DC

## 2015-12-10 MED ORDER — DARIFENACIN HYDROBROMIDE ER 7.5 MG PO TB24
7.5000 mg | ORAL_TABLET | Freq: Every day | ORAL | Status: DC
  Administered 2015-12-10 – 2015-12-14 (×4): 7.5 mg via ORAL
  Filled 2015-12-10 (×7): qty 1

## 2015-12-10 MED ORDER — SENNA 8.6 MG PO TABS: 1.0000 | ORAL_TABLET | Freq: Two times a day (BID) | ORAL | Status: DC | PRN

## 2015-12-10 MED ORDER — TAMSULOSIN HCL 0.4 MG PO CAPS
0.4000 mg | ORAL_CAPSULE | Freq: Every day | ORAL | Status: DC
  Administered 2015-12-11 – 2015-12-14 (×3): 0.4 mg via ORAL
  Filled 2015-12-10 (×3): qty 1

## 2015-12-10 MED FILL — Magnesium Sulfate Inj 50%: INTRAMUSCULAR | Qty: 10 | Status: AC

## 2015-12-10 MED FILL — Heparin Sodium (Porcine) Inj 1000 Unit/ML: INTRAMUSCULAR | Qty: 30 | Status: AC

## 2015-12-10 MED FILL — Potassium Chloride Inj 2 mEq/ML: INTRAVENOUS | Qty: 40 | Status: AC

## 2015-12-10 NOTE — Progress Notes (Signed)
Report given to Lisa, RN.

## 2015-12-10 NOTE — Interval H&P Note (Signed)
History and Physical Interval Note:  12/13/2015 6:10 AM  Sean Hardy  has presented today for surgery, with the diagnosis of AORTIC ROOT ANEURSYM CAD  The various methods of treatment have been discussed with the patient and family. After consideration of risks, benefits and other options for treatment, the patient has consented to  Procedure(s): VALVE SPARING ROOT REPLACEMENT (N/A) CORONARY ARTERY BYPASS GRAFTING (CABG) (N/A) POSSIBLE CLIPPING OF ATRIAL APPENDAGE (N/A) TRANSESOPHAGEAL ECHOCARDIOGRAM (TEE) (N/A) as a surgical intervention .  The patient's history has been reviewed, patient examined, no change in status, stable for surgery.  I have reviewed the patient's chart and labs.  Questions were answered to the patient's satisfaction.     Alleen BorneBryan K Bartle

## 2015-12-10 NOTE — Progress Notes (Signed)
ANTICOAGULATION CONSULT NOTE - Initial Consult  Pharmacy Consult for heparin Indication: atrial fibrillation  Allergies  Allergen Reactions  . Penicillins Other (See Comments)    Unknown. Has patient had a PCN reaction causing immediate rash, facial/tongue/throat swelling, SOB or lightheadedness with hypotension: No Has patient had a PCN reaction causing severe rash involving mucus membranes or skin necrosis: No Has patient had a PCN reaction that required hospitalization No Has patient had a PCN reaction occurring within the last 10 years: No If all of the above answers are "NO", then may proceed with Cephalosporin use.     Patient Measurements: Height: 6\' 1"  (185.4 cm) Weight: 259 lb (117.482 kg) IBW/kg (Calculated) : 79.9 Heparin Dosing Weight: 105.25 kg  Vital Signs: Temp: 97.7 F (36.5 C) (07/17 0617) Temp Source: Oral (07/17 0617) BP: 142/71 mmHg (07/17 0617) Pulse Rate: 52 (07/17 0617)  Labs:  Recent Labs  12/11/2015 0606  LABPROT 14.8  INR 1.14    Estimated Creatinine Clearance: 76.5 mL/min (by C-G formula based on Cr of 1.31).   Medical History: Past Medical History  Diagnosis Date  . Arteriosclerotic cardiovascular disease (ASCVD) 2006    Stents to unknown arteries [facility] ; echo in 2006-normal EF, moderate LVH, mild inferior hypokinesis  . Hypertension   . 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 Hypothyroidism   . Atrial flutter (HCC) 2012    Left atrial appendage thrombus with SEC; converted on amiodarone to marked SB  . Cardiomyopathy 2012    CHF in 10/2010; possibly tachycardia mediated; EF of 20-25%  . Chronic anticoagulation 2012    Discontinued  . Tobacco abuse     40 pack years; Tapering use; 10/2012:1/5 pack per day  . Gout   . Benign prostatic hypertrophy   . Stroke (HCC)   . Anxiety   . Major depressive disorder (HCC)   . CHF (congestive heart failure) (HCC)   .  Hyperlipidemia   . Peripheral vascular disease (HCC)   . Atrial fibrillation (HCC)   . GERD (gastroesophageal reflux disease)   . Muscle weakness   . Aortic aneurysm, thoracic (HCC)   . Coronary artery disease   . High cholesterol   . Nephrolithiasis     Lithotripsy; left ureteral calculus in 10/2012    Medications:  See EMR  Assessment: 65 yo male admitted for valve replacement and CABG. Pt has a history of AFib and is on Xarelto PTA - last dose was 7/14. Will transition over to heparin gtt. SCr 1.3, eCrCl 65-70 ml/min, cbc wnl.   Goal of Therapy:  Heparin level 0.3-0.7 units/ml Monitor platelets by anticoagulation protocol: Yes    Plan:  -Begin heparin at 1450 units/hr, hold bolus -Daily HL, CBC -Check heparin level + aptt in 6 hours   Baldemar FridayMasters, Tahesha Skeet M 12/24/2015,9:01 AM

## 2015-12-10 NOTE — Progress Notes (Signed)
[facility] what [facility]. [facility].

## 2015-12-10 NOTE — Anesthesia Procedure Notes (Addendum)
Central Venous Catheter Insertion Performed by: anesthesiologist Patient location: Pre-op. Preanesthetic checklist: patient identified, IV checked, site marked, risks and benefits discussed, surgical consent, monitors and equipment checked, pre-op evaluation, timeout performed and anesthesia consent Lidocaine 1% used for infiltration Landmarks identified Catheter size: 9 Fr MAC introducer Procedure performed using ultrasound guided technique. Attempts: 1 Following insertion, line sutured and dressing applied. Post procedure assessment: blood return through all ports, free fluid flow and no air. Patient tolerated the procedure well with no immediate complications.   Central Venous Catheter Insertion Performed by: anesthesiologist Patient location: Pre-op. Preanesthetic checklist: patient identified, IV checked, site marked, risks and benefits discussed, surgical consent, monitors and equipment checked, pre-op evaluation, timeout performed and anesthesia consent Landmarks identified PA cath was placed.Swan type and PA catheter depth:thermodilation and 60PA Cath depth:60 Procedure performed using ultrasound guided technique. Attempts: 1 Patient tolerated the procedure well with no immediate complications.

## 2015-12-10 NOTE — Progress Notes (Signed)
Paged dr Lavinia SharpsBartel in regard to xarelto last dose being 12/08/15 waiting on PT/INR results from today

## 2015-12-10 NOTE — Progress Notes (Signed)
Attempted to [facility] to verify medications that the patient was given this morning.  No answer at main number will attempt again

## 2015-12-10 NOTE — Progress Notes (Signed)
ANTICOAGULATION CONSULT NOTE - Initial Consult  Pharmacy Consult for heparin Indication: atrial fibrillation  Allergies  Allergen Reactions  . Penicillins Other (See Comments)    Unknown. Has patient had a PCN reaction causing immediate rash, facial/tongue/throat swelling, SOB or lightheadedness with hypotension: No Has patient had a PCN reaction causing severe rash involving mucus membranes or skin necrosis: No Has patient had a PCN reaction that required hospitalization No Has patient had a PCN reaction occurring within the last 10 years: No If all of the above answers are "NO", then may proceed with Cephalosporin use.     Patient Measurements: Height: 6\' 1"  (185.4 cm) Weight: 255 lb 9.6 oz (115.939 kg) IBW/kg (Calculated) : 79.9 Heparin Dosing Weight: 105.25 kg  Vital Signs: Temp: 97.8 F (36.6 C) (07/17 1505) Temp Source: Oral (07/17 1505) BP: 141/67 mmHg (07/17 1505) Pulse Rate: 57 (07/17 1505)  Labs:  Recent Labs  2016-05-20 0606 2016-05-20 1745  APTT  --  77*  LABPROT 14.8  --   INR 1.14  --   HEPARINUNFRC  --  0.41    Estimated Creatinine Clearance: 76 mL/min (by C-G formula based on Cr of 1.31).   Medical History: Past Medical History  Diagnosis Date  . Arteriosclerotic cardiovascular disease (ASCVD) 2006    St[facility] to unknown arteries [facility] at [facility] ; echo in 2006-normal EF, moderate LVH, mild inferior hypokinesis  . Hypertension   . 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 Hypothyroidism   . Atrial flutter (HCC) 2012    Left atrial appendage thrombus with SEC; converted on amiodarone to marked SB  . Cardiomyopathy 2012    CHF in 10/2010; possibly tachycardia mediated; EF of 20-25%  . Chronic anticoagulation 2012    Discontinued  . Tobacco abuse     40 pack years; Tapering use; 10/2012:1/5 pack per day  . Gout   . Benign prostatic hypertrophy   . Stroke (HCC)   . Anxiety   . Major depressive  disorder (HCC)   . CHF (congestive heart failure) (HCC)   . Hyperlipidemia   . Peripheral vascular disease (HCC)   . Atrial fibrillation (HCC)   . GERD (gastroesophageal reflux disease)   . Muscle weakness   . Aortic aneurysm, thoracic (HCC)   . Coronary artery disease   . High cholesterol   . Nephrolithiasis     Lithotripsy; left ureteral calculus in 10/2012    Medications:  See EMR  Assessment: 65 yo male admitted for valve replacement and CABG. Pt has a history of AFib and is on Xarelto PTA - last dose was 7/14. Transitioned to heparin gtt. aPTT 77 sec with HL of 0.41; seem to be correlating. Will make further adjustments based on HL. CBC wnl.   Goal of Therapy:  Heparin level 0.3-0.7 units/ml Monitor platelets by anticoagulation protocol: Yes    Plan:  -Continue heparin at 1450 units/hr -Daily HL, CBC -Monitor s/sx bleeding    Sherle Poeob Vincent, PharmD Clinical Pharmacist  6:58 PM, 06/30/15

## 2015-12-10 NOTE — Progress Notes (Signed)
Attempted report, RN unavailable, left call back number with [place]

## 2015-12-10 NOTE — Progress Notes (Signed)
Patient ID: Sean Hardy, male   DOB: 07/18/1950, 65 y.o.   MRN: [id number]  [name] arrived for surgery this am and has reportedly only been off Xarelto since 7/15. With his mildly elevated creatinine and major cardiac surgery my office had [facility] to be off of the Xarelto for a week prior to surgery. Due to the risk of significant life-threatening bleeding I decided to postpone his surgery until later this week. He will be admitted for IV heparin and observation until surgery on Thursday. I tried to notify his social worker with [facility] but she is out of the office today and will return tomorrow. I could not get through to her assistant or supervisor this am.

## 2015-12-10 NOTE — Progress Notes (Signed)
Patient requested that a black watch with gold trim be locked up with security.  Patient signed envelope and watched me drop the watch in the envelope.  Watch walked down to the security in the ED

## 2015-12-10 NOTE — Progress Notes (Signed)
Spoke with [facility]Patrick at [facility] to verify [employer]

## 2015-12-11 ENCOUNTER — Encounter (HOSPITAL_COMMUNITY): Payer: Self-pay | Admitting: General Practice

## 2015-12-11 DIAGNOSIS — I2510 Atherosclerotic heart disease of native coronary artery without angina pectoris: Secondary | ICD-10-CM

## 2015-12-11 DIAGNOSIS — I711 Thoracic aortic aneurysm, ruptured: Secondary | ICD-10-CM

## 2015-12-11 LAB — CBC
HEMATOCRIT: 40.1 % (ref 39.0–52.0)
Hemoglobin: 13.2 g/dL (ref 13.0–17.0)
MCH: 32.1 pg (ref 26.0–34.0)
MCHC: 32.9 g/dL (ref 30.0–36.0)
MCV: 97.6 fL (ref 78.0–100.0)
Platelets: 185 10*3/uL (ref 150–400)
RBC: 4.11 MIL/uL — AB (ref 4.22–5.81)
RDW: 13.5 % (ref 11.5–15.5)
WBC: 5.2 10*3/uL (ref 4.0–10.5)

## 2015-12-11 LAB — HEPARIN LEVEL (UNFRACTIONATED): Heparin Unfractionated: 0.57 IU/mL (ref 0.30–0.70)

## 2015-12-11 NOTE — Progress Notes (Signed)
Patient sitting on side of bed, no needs at this time. Did not want any pain medication, call light within reach

## 2015-12-11 NOTE — Care Management Note (Signed)
Case Management Note Donn PieriniKristi Glada Wickstrom RN, BSN Unit 2W-Case Manager [phone]  Patient Details  Name: Sean BoatmanDanny L Hardy MRN: [id number] Date of Birth: 04/26/1951  Subjective/Objective:     Pt admitted for planned procedure- delayed until thur- 7/20- [facility]               Action/Plan: PTA [facility]- [facility]- [facility]- CSW to follow  Expected Discharge Date:                  Expected Discharge Plan:  Skilled Nursing Facility  In-House Referral:  Clinical Social Work  Discharge planning Services  CM Consult  Post Acute Care Choice:    Choice offered to:     DME Arranged:    DME Agency:     HH Arranged:    HH Agency:     Status of Service:  In process, will continue to follow  If discussed at Long Length of Stay Meetings, dates discussed:    Additional Comments:  Darrold SpanWebster, Danine Hor Hall, RN 12/11/2015, 11:09 PM

## 2015-12-11 NOTE — Progress Notes (Addendum)
      [address]       [place], [id number]             [phone]      1 Day Post-Op Procedure(s) (LRB): VALVE SPARING ROOT REPLACEMENT (N/A) CORONARY ARTERY BYPASS GRAFTING (CABG) (N/A) POSSIBLE CLIPPING OF ATRIAL APPENDAGE (N/A) TRANSESOPHAGEAL ECHOCARDIOGRAM (TEE) (N/A)   Subjective:  [name] has no complaints.    Objective: Vital signs in last 24 hours: Temp:  [97.7 F (36.5 C)-98.1 F (36.7 C)] 98.1 F (36.7 C) (07/18 0434) Pulse Rate:  [52-64] 64 (07/18 0434) Cardiac Rhythm:  [-] Sinus bradycardia;Heart block;Bundle branch block (07/17 2135) Resp:  [18-20] 19 (07/18 0434) BP: (113-154)/(54-75) 124/54 mmHg (07/18 0434) SpO2:  [96 %-100 %] 96 % (07/18 0434) Weight:  [255 lb 9.6 oz (115.939 kg)] 255 lb 9.6 oz (115.939 kg) (07/17 1032)  Intake/Output from previous day: 07/17 0701 - 07/18 0700 In: 720 [P.O.:720] Out: 2300 [Urine:2300] Intake/Output this shift: Total I/O In: -  Out: 200 [Urine:200]  General appearance: alert, cooperative and no distress Heart: regular rate and rhythm Lungs: clear to auscultation bilaterally Abdomen: soft, non-tender; bowel sounds normal; no masses,  no organomegaly Extremities: edema 1-2+ Wound: clean and dry  Lab Results:  Recent Labs  12/11/15 0547  WBC 5.2  HGB 13.2  HCT 40.1  PLT 185   BMET: No results for input(s): NA, K, CL, CO2, GLUCOSE, BUN, CREATININE, CALCIUM in the last 72 hours.  PT/INR:  Recent Labs  12/05/2015 0606  LABPROT 14.8  INR 1.14   ABG    Component Value Date/Time   PHART 7.424 12/06/2015 0944   HCO3 24.3* 12/06/2015 0944   TCO2 25.5 12/06/2015 0944   ACIDBASEDEF 1.0 11/12/2015 0943   O2SAT 92.0 12/06/2015 0944   CBG (last 3)  No results for input(s): GLUCAP in the last 72 hours.  Assessment/Plan: S/P Procedure(s) (LRB): VALVE SPARING ROOT REPLACEMENT (N/A) CORONARY ARTERY BYPASS GRAFTING (CABG) (N/A) POSSIBLE CLIPPING OF ATRIAL APPENDAGE (N/A) TRANSESOPHAGEAL  ECHOCARDIOGRAM (TEE) (N/A)  1. CV- hemodynamically stable continue lopressor, lisinopril, amiodarone, norvasc 2. Renal- creatinine mildly elevated preop, will get BMET for AM, continue Lasix, + LE edema 3. Dispo- patient stable, continue Heparin, for OR Thursday    LOS: 1 day    Sean DandyBARRETT, Sean Hardy 12/11/2015   Chart reviewed, patient examined, agree with above. He is stable on IV heparin. Ambulating with assistance. Plan surgery on Thursday. I called his Child psychotherapistsocial worker and discussed plans with her. She is in agreement.

## 2015-12-11 NOTE — Progress Notes (Signed)
CARDIAC REHAB PHASE I   PRE:  Rate/Rhythm: 72 Sr  BP:  Supine:   Sitting: 152/83  Standing:    SaO2: 100%RA  MODE:  Ambulation: 300 ft   POST:  Rate/Rhythm: 69  BP:  Supine:   Sitting: 154/84  Standing:    SaO2: 93%RA 1040-1115 Pt walked 300 ft holding to IV pole and with gait belt use and asst x 1. Gait fairly steady. Discussed with pt sternal precautions for after surgery and had him stand without using arms. He is going to need reminding as he has tendency to push on arms. Discussed importance of IS and walking after surgery. He has IS and can get to 1500 ml. Gave [facility] booklet and care guide and put on pre op video for him to watch. Has some back pain after walk that was better after lying down. No CP.   Luetta Nuttingharlene Katarzyna Wolven, RN BSN  12/11/2015 11:12 AM

## 2015-12-11 NOTE — Progress Notes (Signed)
ANTICOAGULATION CONSULT NOTE  Pharmacy Consult for heparin Indication: atrial fibrillation  Allergies  Allergen Reactions  . Penicillins Other (See Comments)    Unknown. Has patient had a PCN reaction causing immediate rash, facial/tongue/throat swelling, SOB or lightheadedness with hypotension: No Has patient had a PCN reaction causing severe rash involving mucus membranes or skin necrosis: No Has patient had a PCN reaction that required hospitalization No Has patient had a PCN reaction occurring within the last 10 years: No If all of the above answers are "NO", then may proceed with Cephalosporin use.     Patient Measurements: Height: 6\' 1"  (185.4 cm) Weight: 255 lb 9.6 oz (115.939 kg) IBW/kg (Calculated) : 79.9 Heparin Dosing Weight: 105.25 kg  Vital Signs: Temp: 98.1 F (36.7 C) (07/18 0434) Temp Source: Oral (07/18 0434) BP: 114/74 mmHg (07/18 0941) Pulse Rate: 64 (07/18 0434)  Labs:  Recent Labs  01-13-16 0606 01-13-16 1745 12/11/15 0547  HGB  --   --  13.2  HCT  --   --  40.1  PLT  --   --  185  APTT  --  77*  --   LABPROT 14.8  --   --   INR 1.14  --   --   HEPARINUNFRC  --  0.41 0.57    Estimated Creatinine Clearance: 76 mL/min (by C-G formula based on Cr of 1.31).  Assessment: 65 yo male admitted for valve replacement and CABG. Pt has a history of AFib and is on Xarelto PTA - last dose was 7/14. Transitioned to heparin gtt.   Heparin levels and aPTTs correlating, using just HL moving forward. Remains therapeutic this morning at 0.57 units/mL. Hgb and plts stable, no bleeding noted.  Goal of Therapy:  Heparin level 0.3-0.7 units/ml Monitor platelets by anticoagulation protocol: Yes  Plan:  -Continue heparin at 1450 units/hr -Daily HL, CBC -Monitor s/sx bleeding   Tian Mcmurtrey D. Kinston Magnan, PharmD, BCPS Clinical Pharmacist Pager: [phone] 12/11/2015 10:12 AM

## 2015-12-12 LAB — CBC
HEMATOCRIT: 39.1 % (ref 39.0–52.0)
HEMOGLOBIN: 13.1 g/dL (ref 13.0–17.0)
MCH: 32.7 pg (ref 26.0–34.0)
MCHC: 33.5 g/dL (ref 30.0–36.0)
MCV: 97.5 fL (ref 78.0–100.0)
PLATELETS: 170 10*3/uL (ref 150–400)
RBC: 4.01 MIL/uL — ABNORMAL LOW (ref 4.22–5.81)
RDW: 13.5 % (ref 11.5–15.5)
WBC: 6.1 10*3/uL (ref 4.0–10.5)

## 2015-12-12 LAB — BASIC METABOLIC PANEL
Anion gap: 10 (ref 5–15)
BUN: 14 mg/dL (ref 6–20)
CO2: 23 mmol/L (ref 22–32)
Calcium: 9 mg/dL (ref 8.9–10.3)
Chloride: 106 mmol/L (ref 101–111)
Creatinine, Ser: 1.24 mg/dL (ref 0.61–1.24)
GFR calc Af Amer: >60 mL/min (ref >60)
GFR, EST NON AFRICAN AMERICAN: 60 mL/min — AB (ref >60)
GLUCOSE: 112 mg/dL — AB (ref 65–99)
POTASSIUM: 3.8 mmol/L (ref 3.5–5.1)
Sodium: 139 mmol/L (ref 135–145)

## 2015-12-12 LAB — HEPARIN LEVEL (UNFRACTIONATED): Heparin Unfractionated: 0.54 IU/mL (ref 0.30–0.70)

## 2015-12-12 MED ORDER — DIAZEPAM 5 MG PO TABS
5.0000 mg | ORAL_TABLET | Freq: Once | ORAL | Status: AC
  Administered 2015-12-13: 5 mg via ORAL
  Filled 2015-12-12: qty 1

## 2015-12-12 MED ORDER — BISACODYL 5 MG PO TBEC
5.0000 mg | DELAYED_RELEASE_TABLET | Freq: Once | ORAL | Status: AC
  Administered 2015-12-12: 5 mg via ORAL
  Filled 2015-12-12: qty 1

## 2015-12-12 MED ORDER — DEXMEDETOMIDINE HCL IN NACL 400 MCG/100ML IV SOLN
0.1000 ug/kg/h | INTRAVENOUS | Status: DC
  Administered 2015-12-13: .5 ug/kg/h via INTRAVENOUS
  Filled 2015-12-12: qty 100

## 2015-12-12 MED ORDER — CHLORHEXIDINE GLUCONATE CLOTH 2 % EX PADS
6.0000 | MEDICATED_PAD | Freq: Once | CUTANEOUS | Status: AC
  Administered 2015-12-13: 6 via TOPICAL

## 2015-12-12 MED ORDER — PLASMA-LYTE 148 IV SOLN
INTRAVENOUS | Status: DC
  Filled 2015-12-12: qty 2.5

## 2015-12-12 MED ORDER — DEXTROSE 5 % IV SOLN
1.5000 g | INTRAVENOUS | Status: DC
  Filled 2015-12-12: qty 1.5

## 2015-12-12 MED ORDER — VANCOMYCIN HCL 10 G IV SOLR
1250.0000 mg | INTRAVENOUS | Status: AC
  Administered 2015-12-13: 1250 mg via INTRAVENOUS
  Filled 2015-12-12 (×2): qty 1250

## 2015-12-12 MED ORDER — DOPAMINE-DEXTROSE 3.2-5 MG/ML-% IV SOLN
0.0000 ug/kg/min | INTRAVENOUS | Status: DC
Start: 2015-12-13 — End: 2015-12-14
  Administered 2015-12-13: 3 ug/kg/min via INTRAVENOUS
  Filled 2015-12-12 (×2): qty 250

## 2015-12-12 MED ORDER — EPINEPHRINE HCL 1 MG/ML IJ SOLN
0.0000 ug/min | INTRAMUSCULAR | Status: DC
  Filled 2015-12-12: qty 4

## 2015-12-12 MED ORDER — SODIUM CHLORIDE 0.9 % IV SOLN
INTRAVENOUS | Status: AC
  Administered 2015-12-13: .7 [IU]/h via INTRAVENOUS
  Filled 2015-12-12: qty 2.5

## 2015-12-12 MED ORDER — CHLORHEXIDINE GLUCONATE CLOTH 2 % EX PADS
6.0000 | MEDICATED_PAD | Freq: Once | CUTANEOUS | Status: AC
  Administered 2015-12-12: 6 via TOPICAL

## 2015-12-12 MED ORDER — NITROGLYCERIN IN D5W 200-5 MCG/ML-% IV SOLN
2.0000 ug/min | INTRAVENOUS | Status: AC
Start: 2015-12-13 — End: 2015-12-13
  Administered 2015-12-13: 5 ug/min via INTRAVENOUS
  Filled 2015-12-12: qty 250

## 2015-12-12 MED ORDER — PHENYLEPHRINE HCL 10 MG/ML IJ SOLN
30.0000 ug/min | INTRAMUSCULAR | Status: AC
  Administered 2015-12-13: 30 ug/min via INTRAVENOUS
  Filled 2015-12-12: qty 2

## 2015-12-12 MED ORDER — POTASSIUM CHLORIDE 2 MEQ/ML IV SOLN
80.0000 meq | INTRAVENOUS | Status: DC
  Filled 2015-12-12: qty 40

## 2015-12-12 MED ORDER — DEXTROSE 5 % IV SOLN
750.0000 mg | INTRAVENOUS | Status: DC
  Filled 2015-12-12: qty 750

## 2015-12-12 MED ORDER — CHLORHEXIDINE GLUCONATE 0.12 % MT SOLN
15.0000 mL | Freq: Once | OROMUCOSAL | Status: AC
  Administered 2015-12-13: 15 mL via OROMUCOSAL
  Filled 2015-12-12: qty 15

## 2015-12-12 MED ORDER — METOPROLOL TARTRATE 12.5 MG HALF TABLET
12.5000 mg | ORAL_TABLET | Freq: Once | ORAL | Status: AC
  Administered 2015-12-13: 12.5 mg via ORAL
  Filled 2015-12-12: qty 1

## 2015-12-12 MED ORDER — TEMAZEPAM 15 MG PO CAPS: 15.0000 mg | ORAL_CAPSULE | Freq: Once | ORAL | Status: DC | PRN

## 2015-12-12 MED ORDER — SODIUM CHLORIDE 0.9 % IV SOLN
INTRAVENOUS | Status: AC
  Administered 2015-12-13: 19:00:00 via INTRAVENOUS
  Administered 2015-12-13: 69.8 mL/h via INTRAVENOUS
  Filled 2015-12-12: qty 40

## 2015-12-12 MED ORDER — MAGNESIUM SULFATE 50 % IJ SOLN
40.0000 meq | INTRAMUSCULAR | Status: DC
  Filled 2015-12-12: qty 10

## 2015-12-12 MED ORDER — SODIUM CHLORIDE 0.9 % IV SOLN
INTRAVENOUS | Status: DC
  Filled 2015-12-12: qty 30

## 2015-12-12 NOTE — Progress Notes (Signed)
ANTICOAGULATION CONSULT NOTE  Pharmacy Consult for heparin Indication: atrial fibrillation  Allergies  Allergen Reactions  . Penicillins Other (See Comments)    Unknown. Has patient had a PCN reaction causing immediate rash, facial/tongue/throat swelling, SOB or lightheadedness with hypotension: No Has patient had a PCN reaction causing severe rash involving mucus membranes or skin necrosis: No Has patient had a PCN reaction that required hospitalization No Has patient had a PCN reaction occurring within the last 10 years: No If all of the above answers are "NO", then may proceed with Cephalosporin use.     Patient Measurements: Height: 6\' 1"  (185.4 cm) Weight: 255 lb 9.6 oz (115.939 kg) IBW/kg (Calculated) : 79.9 Heparin Dosing Weight: 105.25 kg  Vital Signs: Temp: 98 F (36.7 C) (07/19 0514) Temp Source: Oral (07/19 0514) BP: 131/79 mmHg (07/19 0830) Pulse Rate: 62 (07/19 0830)  Labs:  Recent Labs  11/26/2015 0606 12/19/2015 1745 12/11/15 0547 12/12/15 0318  HGB  --   --  13.2 13.1  HCT  --   --  40.1 39.1  PLT  --   --  185 170  APTT  --  77*  --   --   LABPROT 14.8  --   --   --   INR 1.14  --   --   --   HEPARINUNFRC  --  0.41 0.57 0.54  CREATININE  --   --   --  1.24    Estimated Creatinine Clearance: 80.3 mL/min (by C-G formula based on Cr of 1.24).  Assessment: 65 yo male admitted for valve replacement and CABG. Pt has a history of AFib and is on Xarelto PTA - last dose was 7/14. Transitioned to heparin gtt.  -Heparin level is 0.4 and and goal, CBC stable  Goal of Therapy:  Heparin level 0.3-0.7 units/ml Monitor platelets by anticoagulation protocol: Yes  Plan:  -Continue heparin at 1450 units/hr -Daily HL, CBC  Harland Germanndrew Kirt Chew, Pharm D 12/12/2015 9:58 AM

## 2015-12-12 NOTE — Progress Notes (Addendum)
      [address]       [place] [id number]             [phone]      2 Days Post-Op Procedure(s) (LRB): VALVE SPARING ROOT REPLACEMENT (N/A) CORONARY ARTERY BYPASS GRAFTING (CABG) (N/A) POSSIBLE CLIPPING OF ATRIAL APPENDAGE (N/A) TRANSESOPHAGEAL ECHOCARDIOGRAM (TEE) (N/A)   Subjective:  [name] has no new complaints.  Getting a bathed this morning.  Objective: Vital signs in last 24 hours: Temp:  [97.5 F (36.4 C)-98 F (36.7 C)] 98 F (36.7 C) (07/19 0514) Pulse Rate:  [54-62] 62 (07/19 0830) Cardiac Rhythm:  [-] Heart block (07/18 1900) Resp:  [20] 20 (07/19 0514) BP: (114-163)/(61-88) 131/79 mmHg (07/19 0830) SpO2:  [95 %-96 %] 96 % (07/19 0514)  Intake/Output from previous day: 07/18 0701 - 07/19 0700 In: 480 [P.O.:480] Out: 1600 [Urine:1600] Intake/Output this shift: Total I/O In: -  Out: 425 [Urine:425]  General appearance: alert, cooperative and no distress Heart: regular rate and rhythm Lungs: clear to auscultation bilaterally Abdomen: soft, non-tender; bowel sounds normal; no masses,  no organomegaly Extremities: extremities normal, atraumatic, no cyanosis or edema Wound: clean and dry  Lab Results:  Recent Labs  12/11/15 0547 12/12/15 0318  WBC 5.2 6.1  HGB 13.2 13.1  HCT 40.1 39.1  PLT 185 170   BMET:  Recent Labs  12/12/15 0318  NA 139  K 3.8  CL 106  CO2 23  GLUCOSE 112*  BUN 14  CREATININE 1.24  CALCIUM 9.0    PT/INR:  Recent Labs  12/24/2015 0606  LABPROT 14.8  INR 1.14   ABG    Component Value Date/Time   PHART 7.424 12/06/2015 0944   HCO3 24.3* 12/06/2015 0944   TCO2 25.5 12/06/2015 0944   ACIDBASEDEF 1.0 11/12/2015 0943   O2SAT 92.0 12/06/2015 0944   CBG (last 3)  No results for input(s): GLUCAP in the last 72 hours.  Assessment/Plan: S/P Procedure(s) (LRB): VALVE SPARING ROOT REPLACEMENT (N/A) CORONARY ARTERY BYPASS GRAFTING (CABG) (N/A) POSSIBLE CLIPPING OF ATRIAL APPENDAGE  (N/A) TRANSESOPHAGEAL ECHOCARDIOGRAM (TEE) (N/A)  1. CV- hemodynamically stable- continue Lopressor, Amiodarone, Lisinopril, Norvasc.... Continue Heparin 2. Renal- creatinine stable at 1.24 3. Dispo- patient stable, for OR tomorrow   LOS: 2 days    Sean DandyBARRETT, ERIN 12/12/2015   Chart reviewed, patient examined, agree with above. He is stable for surgery in the am. Will stop heparin at [place] tonight.

## 2015-12-13 ENCOUNTER — Inpatient Hospital Stay (HOSPITAL_COMMUNITY): Payer: Medicare Other | Admitting: Anesthesiology

## 2015-12-13 ENCOUNTER — Inpatient Hospital Stay (HOSPITAL_COMMUNITY): Payer: Medicare Other

## 2015-12-13 ENCOUNTER — Encounter (HOSPITAL_COMMUNITY): Admission: RE | Disposition: E | Payer: Self-pay | Source: Ambulatory Visit | Attending: Surgery

## 2015-12-13 DIAGNOSIS — I714 Abdominal aortic aneurysm, without rupture: Secondary | ICD-10-CM

## 2015-12-13 DIAGNOSIS — I2510 Atherosclerotic heart disease of native coronary artery without angina pectoris: Secondary | ICD-10-CM

## 2015-12-13 HISTORY — PX: CORONARY ARTERY BYPASS GRAFT: SHX141

## 2015-12-13 HISTORY — PX: BENTALL PROCEDURE: SHX5058

## 2015-12-13 HISTORY — PX: CLIPPING OF ATRIAL APPENDAGE: SHX5773

## 2015-12-13 HISTORY — PX: THORACIC AORTIC ANEURYSM REPAIR: SHX799

## 2015-12-13 HISTORY — PX: TEE WITHOUT CARDIOVERSION: SHX5443

## 2015-12-13 LAB — POCT I-STAT, CHEM 8
BUN: 16 mg/dL (ref 6–20)
BUN: 13 mg/dL (ref 6–20)
BUN: 16 mg/dL (ref 6–20)
BUN: 15 mg/dL (ref 6–20)
BUN: 14 mg/dL (ref 6–20)
BUN: 15 mg/dL (ref 6–20)
BUN: 15 mg/dL (ref 6–20)
BUN: 14 mg/dL (ref 6–20)
BUN: 14 mg/dL (ref 6–20)
BUN: 14 mg/dL (ref 6–20)
BUN: 15 mg/dL (ref 6–20)
BUN: 14 mg/dL (ref 6–20)
BUN: 14 mg/dL (ref 6–20)
BUN: 13 mg/dL (ref 6–20)
BUN: 14 mg/dL (ref 6–20)
BUN: 14 mg/dL (ref 6–20)
CALCIUM ION: 1.22 mmol/L (ref 1.12–1.23)
CALCIUM ION: 0.95 mmol/L — AB (ref 1.12–1.23)
CALCIUM ION: 1.27 mmol/L — AB (ref 1.12–1.23)
CALCIUM ION: 0.93 mmol/L — AB (ref 1.12–1.23)
CALCIUM ION: 0.85 mmol/L — AB (ref 1.12–1.23)
CALCIUM ION: 0.87 mmol/L — AB (ref 1.12–1.23)
CALCIUM ION: 0.83 mmol/L — AB (ref 1.12–1.23)
CALCIUM ION: 0.85 mmol/L — AB (ref 1.12–1.23)
CALCIUM ION: 0.78 mmol/L — AB (ref 1.12–1.23)
CALCIUM ION: 1.07 mmol/L — AB (ref 1.12–1.23)
CHLORIDE: 103 mmol/L (ref 101–111)
CHLORIDE: 100 mmol/L — AB (ref 101–111)
CHLORIDE: 100 mmol/L — AB (ref 101–111)
CHLORIDE: 104 mmol/L (ref 101–111)
CHLORIDE: 105 mmol/L (ref 101–111)
CREATININE: 1.1 mg/dL (ref 0.61–1.24)
CREATININE: 0.8 mg/dL (ref 0.61–1.24)
CREATININE: 0.8 mg/dL (ref 0.61–1.24)
CREATININE: 0.8 mg/dL (ref 0.61–1.24)
CREATININE: 0.7 mg/dL (ref 0.61–1.24)
Calcium, Ion: 1.08 mmol/L — ABNORMAL LOW (ref 1.12–1.23)
Calcium, Ion: 0.94 mmol/L — ABNORMAL LOW (ref 1.12–1.23)
Calcium, Ion: 0.92 mmol/L — ABNORMAL LOW (ref 1.12–1.23)
Calcium, Ion: 0.82 mmol/L — ABNORMAL LOW (ref 1.12–1.23)
Calcium, Ion: 0.81 mmol/L — ABNORMAL LOW (ref 1.12–1.23)
Calcium, Ion: 0.78 mmol/L — ABNORMAL LOW (ref 1.12–1.23)
Chloride: 103 mmol/L (ref 101–111)
Chloride: 98 mmol/L — ABNORMAL LOW (ref 101–111)
Chloride: 100 mmol/L — ABNORMAL LOW (ref 101–111)
Chloride: 99 mmol/L — ABNORMAL LOW (ref 101–111)
Chloride: 102 mmol/L (ref 101–111)
Chloride: 104 mmol/L (ref 101–111)
Chloride: 104 mmol/L (ref 101–111)
Chloride: 104 mmol/L (ref 101–111)
Chloride: 104 mmol/L (ref 101–111)
Chloride: 103 mmol/L (ref 101–111)
Chloride: 106 mmol/L (ref 101–111)
Creatinine, Ser: 1.1 mg/dL (ref 0.61–1.24)
Creatinine, Ser: 0.8 mg/dL (ref 0.61–1.24)
Creatinine, Ser: 0.7 mg/dL (ref 0.61–1.24)
Creatinine, Ser: 0.7 mg/dL (ref 0.61–1.24)
Creatinine, Ser: 0.8 mg/dL (ref 0.61–1.24)
Creatinine, Ser: 0.7 mg/dL (ref 0.61–1.24)
Creatinine, Ser: 0.7 mg/dL (ref 0.61–1.24)
Creatinine, Ser: 0.7 mg/dL (ref 0.61–1.24)
Creatinine, Ser: 0.7 mg/dL (ref 0.61–1.24)
Creatinine, Ser: 0.8 mg/dL (ref 0.61–1.24)
Creatinine, Ser: 0.8 mg/dL (ref 0.61–1.24)
GLUCOSE: 97 mg/dL (ref 65–99)
GLUCOSE: 100 mg/dL — AB (ref 65–99)
GLUCOSE: 108 mg/dL — AB (ref 65–99)
GLUCOSE: 163 mg/dL — AB (ref 65–99)
GLUCOSE: 154 mg/dL — AB (ref 65–99)
GLUCOSE: 134 mg/dL — AB (ref 65–99)
GLUCOSE: 131 mg/dL — AB (ref 65–99)
Glucose, Bld: 116 mg/dL — ABNORMAL HIGH (ref 65–99)
Glucose, Bld: 116 mg/dL — ABNORMAL HIGH (ref 65–99)
Glucose, Bld: 109 mg/dL — ABNORMAL HIGH (ref 65–99)
Glucose, Bld: 119 mg/dL — ABNORMAL HIGH (ref 65–99)
Glucose, Bld: 133 mg/dL — ABNORMAL HIGH (ref 65–99)
Glucose, Bld: 135 mg/dL — ABNORMAL HIGH (ref 65–99)
Glucose, Bld: 140 mg/dL — ABNORMAL HIGH (ref 65–99)
Glucose, Bld: 137 mg/dL — ABNORMAL HIGH (ref 65–99)
Glucose, Bld: 148 mg/dL — ABNORMAL HIGH (ref 65–99)
HCT: 27 % — ABNORMAL LOW (ref 39.0–52.0)
HCT: 26 % — ABNORMAL LOW (ref 39.0–52.0)
HCT: 26 % — ABNORMAL LOW (ref 39.0–52.0)
HCT: 26 % — ABNORMAL LOW (ref 39.0–52.0)
HCT: 23 % — ABNORMAL LOW (ref 39.0–52.0)
HCT: 29 % — ABNORMAL LOW (ref 39.0–52.0)
HEMATOCRIT: 36 % — AB (ref 39.0–52.0)
HEMATOCRIT: 33 % — AB (ref 39.0–52.0)
HEMATOCRIT: 26 % — AB (ref 39.0–52.0)
HEMATOCRIT: 26 % — AB (ref 39.0–52.0)
HEMATOCRIT: 26 % — AB (ref 39.0–52.0)
HEMATOCRIT: 23 % — AB (ref 39.0–52.0)
HEMATOCRIT: 27 % — AB (ref 39.0–52.0)
HEMATOCRIT: 25 % — AB (ref 39.0–52.0)
HEMATOCRIT: 25 % — AB (ref 39.0–52.0)
HEMATOCRIT: 25 % — AB (ref 39.0–52.0)
HEMOGLOBIN: 11.2 g/dL — AB (ref 13.0–17.0)
HEMOGLOBIN: 9.2 g/dL — AB (ref 13.0–17.0)
HEMOGLOBIN: 8.8 g/dL — AB (ref 13.0–17.0)
HEMOGLOBIN: 8.8 g/dL — AB (ref 13.0–17.0)
HEMOGLOBIN: 8.8 g/dL — AB (ref 13.0–17.0)
HEMOGLOBIN: 7.8 g/dL — AB (ref 13.0–17.0)
HEMOGLOBIN: 8.8 g/dL — AB (ref 13.0–17.0)
HEMOGLOBIN: 9.2 g/dL — AB (ref 13.0–17.0)
HEMOGLOBIN: 8.5 g/dL — AB (ref 13.0–17.0)
HEMOGLOBIN: 8.5 g/dL — AB (ref 13.0–17.0)
HEMOGLOBIN: 8.5 g/dL — AB (ref 13.0–17.0)
HEMOGLOBIN: 9.9 g/dL — AB (ref 13.0–17.0)
Hemoglobin: 12.2 g/dL — ABNORMAL LOW (ref 13.0–17.0)
Hemoglobin: 8.8 g/dL — ABNORMAL LOW (ref 13.0–17.0)
Hemoglobin: 8.8 g/dL — ABNORMAL LOW (ref 13.0–17.0)
Hemoglobin: 7.8 g/dL — ABNORMAL LOW (ref 13.0–17.0)
POTASSIUM: 4 mmol/L (ref 3.5–5.1)
POTASSIUM: 4.4 mmol/L (ref 3.5–5.1)
POTASSIUM: 5.8 mmol/L — AB (ref 3.5–5.1)
POTASSIUM: 3.8 mmol/L (ref 3.5–5.1)
POTASSIUM: 4.4 mmol/L (ref 3.5–5.1)
POTASSIUM: 4.4 mmol/L (ref 3.5–5.1)
Potassium: 4 mmol/L (ref 3.5–5.1)
Potassium: 3.8 mmol/L (ref 3.5–5.1)
Potassium: 3.7 mmol/L (ref 3.5–5.1)
Potassium: 4.8 mmol/L (ref 3.5–5.1)
Potassium: 4.8 mmol/L (ref 3.5–5.1)
Potassium: 4.6 mmol/L (ref 3.5–5.1)
Potassium: 4.3 mmol/L (ref 3.5–5.1)
Potassium: 4.6 mmol/L (ref 3.5–5.1)
Potassium: 4.5 mmol/L (ref 3.5–5.1)
Potassium: 4.9 mmol/L (ref 3.5–5.1)
SODIUM: 140 mmol/L (ref 135–145)
SODIUM: 138 mmol/L (ref 135–145)
SODIUM: 134 mmol/L — AB (ref 135–145)
SODIUM: 134 mmol/L — AB (ref 135–145)
SODIUM: 134 mmol/L — AB (ref 135–145)
SODIUM: 138 mmol/L (ref 135–145)
SODIUM: 139 mmol/L (ref 135–145)
SODIUM: 140 mmol/L (ref 135–145)
SODIUM: 140 mmol/L (ref 135–145)
SODIUM: 141 mmol/L (ref 135–145)
SODIUM: 141 mmol/L (ref 135–145)
Sodium: 140 mmol/L (ref 135–145)
Sodium: 140 mmol/L (ref 135–145)
Sodium: 136 mmol/L (ref 135–145)
Sodium: 137 mmol/L (ref 135–145)
Sodium: 140 mmol/L (ref 135–145)
TCO2: 26 mmol/L (ref 0–100)
TCO2: 27 mmol/L (ref 0–100)
TCO2: 28 mmol/L (ref 0–100)
TCO2: 30 mmol/L (ref 0–100)
TCO2: 28 mmol/L (ref 0–100)
TCO2: 29 mmol/L (ref 0–100)
TCO2: 27 mmol/L (ref 0–100)
TCO2: 26 mmol/L (ref 0–100)
TCO2: 25 mmol/L (ref 0–100)
TCO2: 25 mmol/L (ref 0–100)
TCO2: 25 mmol/L (ref 0–100)
TCO2: 23 mmol/L (ref 0–100)
TCO2: 25 mmol/L (ref 0–100)
TCO2: 23 mmol/L (ref 0–100)
TCO2: 23 mmol/L (ref 0–100)
TCO2: 20 mmol/L (ref 0–100)

## 2015-12-13 LAB — POCT I-STAT 3, ART BLOOD GAS (G3+)
ACID-BASE DEFICIT: 7 mmol/L — AB (ref 0.0–2.0)
ACID-BASE EXCESS: 3 mmol/L — AB (ref 0.0–2.0)
Acid-base deficit: 1 mmol/L (ref 0.0–2.0)
BICARBONATE: 22.4 meq/L (ref 20.0–24.0)
Bicarbonate: 28.2 mEq/L — ABNORMAL HIGH (ref 20.0–24.0)
Bicarbonate: 18.5 mEq/L — ABNORMAL LOW (ref 20.0–24.0)
O2 SAT: 88 %
O2 Saturation: 100 %
O2 Saturation: 100 %
PCO2 ART: 35.1 mmHg (ref 35.0–45.0)
PH ART: 7.391 (ref 7.350–7.450)
PH ART: 7.487 — AB (ref 7.350–7.450)
TCO2: 30 mmol/L (ref 0–100)
TCO2: 23 mmol/L (ref 0–100)
TCO2: 20 mmol/L (ref 0–100)
pCO2 arterial: 46.5 mmHg — ABNORMAL HIGH (ref 35.0–45.0)
pCO2 arterial: 29.6 mmHg — ABNORMAL LOW (ref 35.0–45.0)
pH, Arterial: 7.329 — ABNORMAL LOW (ref 7.350–7.450)
pO2, Arterial: 492 mmHg — ABNORMAL HIGH (ref 80.0–100.0)
pO2, Arterial: 380 mmHg — ABNORMAL HIGH (ref 80.0–100.0)
pO2, Arterial: 58 mmHg — ABNORMAL LOW (ref 80.0–100.0)

## 2015-12-13 LAB — BASIC METABOLIC PANEL
Anion gap: 6 (ref 5–15)
BUN: 15 mg/dL (ref 6–20)
CHLORIDE: 108 mmol/L (ref 101–111)
CO2: 24 mmol/L (ref 22–32)
CREATININE: 1.24 mg/dL (ref 0.61–1.24)
Calcium: 9 mg/dL (ref 8.9–10.3)
GFR, EST NON AFRICAN AMERICAN: 60 mL/min — AB (ref >60)
Glucose, Bld: 108 mg/dL — ABNORMAL HIGH (ref 65–99)
POTASSIUM: 3.4 mmol/L — AB (ref 3.5–5.1)
Sodium: 138 mmol/L (ref 135–145)

## 2015-12-13 LAB — CBC
HEMATOCRIT: 39.3 % (ref 39.0–52.0)
HEMOGLOBIN: 13.1 g/dL (ref 13.0–17.0)
MCH: 32.5 pg (ref 26.0–34.0)
MCHC: 33.3 g/dL (ref 30.0–36.0)
MCV: 97.5 fL (ref 78.0–100.0)
Platelets: 189 10*3/uL (ref 150–400)
RBC: 4.03 MIL/uL — AB (ref 4.22–5.81)
RDW: 13.6 % (ref 11.5–15.5)
WBC: 5.8 10*3/uL (ref 4.0–10.5)

## 2015-12-13 LAB — FIBRINOGEN
FIBRINOGEN: 143 mg/dL — AB (ref 204–475)
Fibrinogen: 150 mg/dL — ABNORMAL LOW (ref 204–475)

## 2015-12-13 LAB — HEMOGLOBIN AND HEMATOCRIT, BLOOD
HCT: 27.4 % — ABNORMAL LOW (ref 39.0–52.0)
HEMATOCRIT: 26.5 % — AB (ref 39.0–52.0)
HEMATOCRIT: 23.8 % — AB (ref 39.0–52.0)
HEMOGLOBIN: 9.5 g/dL — AB (ref 13.0–17.0)
Hemoglobin: 9.8 g/dL — ABNORMAL LOW (ref 13.0–17.0)
Hemoglobin: 8.4 g/dL — ABNORMAL LOW (ref 13.0–17.0)

## 2015-12-13 LAB — PLATELET COUNT
PLATELETS: 59 10*3/uL — AB (ref 150–400)
Platelets: 99 10*3/uL — ABNORMAL LOW (ref 150–400)
Platelets: 90 10*3/uL — ABNORMAL LOW (ref 150–400)

## 2015-12-13 LAB — HEPARIN LEVEL (UNFRACTIONATED): HEPARIN UNFRACTIONATED: 0.41 [IU]/mL (ref 0.30–0.70)

## 2015-12-13 LAB — PREPARE RBC (CROSSMATCH)

## 2015-12-13 SURGERY — CORONARY ARTERY BYPASS GRAFTING (CABG)
Anesthesia: General | Site: Chest

## 2015-12-13 MED ORDER — HEPARIN SODIUM (PORCINE) 1000 UNIT/ML IJ SOLN
INTRAMUSCULAR | Status: DC | PRN
  Administered 2015-12-13: 43000 [IU] via INTRAVENOUS

## 2015-12-13 MED ORDER — ROCURONIUM BROMIDE 50 MG/5ML IV SOLN
INTRAVENOUS | Status: AC
  Filled 2015-12-13: qty 2

## 2015-12-13 MED ORDER — ALBUMIN HUMAN 5 % IV SOLN
INTRAVENOUS | Status: DC | PRN
  Administered 2015-12-13 (×3): via INTRAVENOUS

## 2015-12-13 MED ORDER — MILRINONE LACTATE IN DEXTROSE 20-5 MG/100ML-% IV SOLN
0.3750 ug/kg/min | INTRAVENOUS | Status: DC
  Administered 2015-12-13: .3 ug/kg/min via INTRAVENOUS
  Administered 2015-12-14 (×3): 0.3 ug/kg/min via INTRAVENOUS
  Administered 2015-12-15: 0.375 ug/kg/min via INTRAVENOUS
  Administered 2015-12-15 (×2): 0.3 ug/kg/min via INTRAVENOUS
  Filled 2015-12-13 (×7): qty 100

## 2015-12-13 MED ORDER — ETOMIDATE 2 MG/ML IV SOLN
INTRAVENOUS | Status: DC | PRN
  Administered 2015-12-13: 20 mg via INTRAVENOUS

## 2015-12-13 MED ORDER — PROTAMINE SULFATE 10 MG/ML IV SOLN
INTRAVENOUS | Status: DC | PRN
  Administered 2015-12-13: 350 mg via INTRAVENOUS

## 2015-12-13 MED ORDER — PROPOFOL 10 MG/ML IV BOLUS
INTRAVENOUS | Status: AC
  Filled 2015-12-13: qty 20

## 2015-12-13 MED ORDER — ROCURONIUM BROMIDE 50 MG/5ML IV SOLN
INTRAVENOUS | Status: AC
  Filled 2015-12-13: qty 1

## 2015-12-13 MED ORDER — MIDAZOLAM HCL 10 MG/2ML IJ SOLN
INTRAMUSCULAR | Status: AC
  Filled 2015-12-13: qty 2

## 2015-12-13 MED ORDER — LACTATED RINGERS IV SOLN
INTRAVENOUS | Status: DC | PRN
  Administered 2015-12-13 (×2): via INTRAVENOUS

## 2015-12-13 MED ORDER — 0.9 % SODIUM CHLORIDE (POUR BTL) OPTIME
TOPICAL | Status: DC | PRN
  Administered 2015-12-13: 8000 mL

## 2015-12-13 MED ORDER — DEXTROSE 5 % IV SOLN
0.0000 ug/min | INTRAVENOUS | Status: DC
  Administered 2015-12-13: 3 ug/min via INTRAVENOUS
  Filled 2015-12-13: qty 4

## 2015-12-13 MED ORDER — HYDROCORTISONE NA SUCCINATE PF 100 MG IJ SOLR
INTRAMUSCULAR | Status: DC | PRN
  Administered 2015-12-13: 250 mg via INTRAVENOUS

## 2015-12-13 MED ORDER — HEPARIN SODIUM (PORCINE) 1000 UNIT/ML IJ SOLN
INTRAMUSCULAR | Status: AC
  Filled 2015-12-13: qty 1

## 2015-12-13 MED ORDER — PLASMA-LYTE 148 IV SOLN
INTRAVENOUS | Status: DC | PRN
  Administered 2015-12-13: 500 mL via INTRAVENOUS

## 2015-12-13 MED ORDER — PHENYLEPHRINE 40 MCG/ML (10ML) SYRINGE FOR IV PUSH (FOR BLOOD PRESSURE SUPPORT)
PREFILLED_SYRINGE | INTRAVENOUS | Status: AC
  Filled 2015-12-13: qty 20

## 2015-12-13 MED ORDER — SODIUM CHLORIDE 0.9 % IV SOLN
INTRAVENOUS | Status: DC
  Filled 2015-12-13: qty 40

## 2015-12-13 MED ORDER — MIDAZOLAM HCL 2 MG/2ML IJ SOLN
INTRAMUSCULAR | Status: AC
  Filled 2015-12-13: qty 2

## 2015-12-13 MED ORDER — THROMBIN 20000 UNITS EX SOLR
OROMUCOSAL | Status: DC | PRN
  Administered 2015-12-13: 09:00:00 via TOPICAL

## 2015-12-13 MED ORDER — PROPOFOL 10 MG/ML IV BOLUS
INTRAVENOUS | Status: DC | PRN
  Administered 2015-12-13: 50 mg via INTRAVENOUS
  Administered 2015-12-13: 140 mg via INTRAVENOUS
  Administered 2015-12-13: 50 mg via INTRAVENOUS

## 2015-12-13 MED ORDER — DEXMEDETOMIDINE HCL IN NACL 200 MCG/50ML IV SOLN
INTRAVENOUS | Status: AC
  Filled 2015-12-13: qty 100

## 2015-12-13 MED ORDER — LEVOFLOXACIN IN D5W 500 MG/100ML IV SOLN
500.0000 mg | INTRAVENOUS | Status: AC
  Administered 2015-12-13: 500 mg via INTRAVENOUS
  Filled 2015-12-13: qty 100

## 2015-12-13 MED ORDER — FENTANYL CITRATE (PF) 250 MCG/5ML IJ SOLN
INTRAMUSCULAR | Status: AC
  Filled 2015-12-13: qty 5

## 2015-12-13 MED ORDER — ARTIFICIAL TEARS OP OINT
TOPICAL_OINTMENT | OPHTHALMIC | Status: AC
  Filled 2015-12-13: qty 3.5

## 2015-12-13 MED ORDER — HYDROCORTISONE NA SUCCINATE PF 250 MG IJ SOLR
INTRAMUSCULAR | Status: AC
  Filled 2015-12-13: qty 250

## 2015-12-13 MED ORDER — THROMBIN 20000 UNITS EX SOLR
CUTANEOUS | Status: AC
  Filled 2015-12-13: qty 20000

## 2015-12-13 MED ORDER — LACTATED RINGERS IV SOLN
INTRAVENOUS | Status: DC | PRN
  Administered 2015-12-13 (×2): via INTRAVENOUS

## 2015-12-13 MED ORDER — LIDOCAINE 2% (20 MG/ML) 5 ML SYRINGE
INTRAMUSCULAR | Status: DC | PRN
  Administered 2015-12-13: 80 mg via INTRAVENOUS

## 2015-12-13 MED ORDER — SODIUM CHLORIDE 0.9 % IJ SOLN
INTRAMUSCULAR | Status: AC
  Filled 2015-12-13: qty 10

## 2015-12-13 MED ORDER — ROCURONIUM BROMIDE 100 MG/10ML IV SOLN
INTRAVENOUS | Status: DC | PRN
  Administered 2015-12-13: 40 mg via INTRAVENOUS
  Administered 2015-12-13: 30 mg via INTRAVENOUS
  Administered 2015-12-13 (×2): 50 mg via INTRAVENOUS
  Administered 2015-12-13: 20 mg via INTRAVENOUS
  Administered 2015-12-13: 60 mg via INTRAVENOUS

## 2015-12-13 MED ORDER — HEMOSTATIC AGENTS (NO CHARGE) OPTIME
TOPICAL | Status: DC | PRN
  Administered 2015-12-13 (×2): 1 via TOPICAL

## 2015-12-13 MED ORDER — SODIUM CHLORIDE 0.9 % IV SOLN
10.0000 mL/h | Freq: Once | INTRAVENOUS | Status: AC
  Administered 2015-12-13: 21:00:00 via INTRAVENOUS

## 2015-12-13 MED ORDER — FENTANYL CITRATE (PF) 250 MCG/5ML IJ SOLN
INTRAMUSCULAR | Status: AC
  Filled 2015-12-13: qty 30

## 2015-12-13 MED ORDER — SUCCINYLCHOLINE CHLORIDE 20 MG/ML IJ SOLN
INTRAMUSCULAR | Status: DC | PRN
  Administered 2015-12-13: 120 mg via INTRAVENOUS

## 2015-12-13 MED ORDER — CALCIUM CHLORIDE 10 % IV SOLN
INTRAVENOUS | Status: DC | PRN
  Administered 2015-12-13 (×2): 1 g via INTRAVENOUS

## 2015-12-13 MED ORDER — FUROSEMIDE 10 MG/ML IJ SOLN
INTRAMUSCULAR | Status: DC | PRN
  Administered 2015-12-13: 80 mg via INTRAMUSCULAR

## 2015-12-13 MED ORDER — PROTAMINE SULFATE 10 MG/ML IV SOLN
INTRAVENOUS | Status: AC
  Filled 2015-12-13: qty 50

## 2015-12-13 MED ORDER — CALCIUM CHLORIDE 10 % IV SOLN
INTRAVENOUS | Status: AC
  Filled 2015-12-13: qty 20

## 2015-12-13 MED ORDER — LACTATED RINGERS IV SOLN
INTRAVENOUS | Status: DC | PRN
  Administered 2015-12-13 (×3): via INTRAVENOUS

## 2015-12-13 MED ORDER — FUROSEMIDE 10 MG/ML IJ SOLN
INTRAMUSCULAR | Status: AC
  Filled 2015-12-13: qty 8

## 2015-12-13 MED ORDER — SODIUM CHLORIDE 0.9 % IV SOLN
0.5000 g/h | INTRAVENOUS | Status: DC
  Filled 2015-12-13: qty 20

## 2015-12-13 MED FILL — Magnesium Sulfate Inj 50%: INTRAMUSCULAR | Qty: 10 | Status: AC

## 2015-12-13 MED FILL — Potassium Chloride Inj 2 mEq/ML: INTRAVENOUS | Qty: 20 | Status: AC

## 2015-12-13 MED FILL — Heparin Sodium (Porcine) Inj 1000 Unit/ML: INTRAMUSCULAR | Qty: 30 | Status: AC

## 2015-12-13 SURGICAL SUPPLY — 143 items
ADAPTER CARDIO PERF ANTE/RETRO (ADAPTER) ×8 IMPLANT
APPLICATOR TIP COSEAL (VASCULAR PRODUCTS) ×12 IMPLANT
ATTRACTOMAT 16X20 MAGNETIC DRP (DRAPES) ×4 IMPLANT
BAG DECANTER FOR FLEXI CONT (MISCELLANEOUS) ×4 IMPLANT
BANDAGE ACE 4X5 VEL STRL LF (GAUZE/BANDAGES/DRESSINGS) ×4 IMPLANT
BANDAGE ACE 6X5 VEL STRL LF (GAUZE/BANDAGES/DRESSINGS) ×4 IMPLANT
BANDAGE ELASTIC 4 VELCRO ST LF (GAUZE/BANDAGES/DRESSINGS) ×4 IMPLANT
BANDAGE ELASTIC 6 VELCRO ST LF (GAUZE/BANDAGES/DRESSINGS) ×4 IMPLANT
BANDAGE ESMARK 6X9 LF (GAUZE/BANDAGES/DRESSINGS) ×2 IMPLANT
BASKET HEART  (ORDER IN 25'S) (MISCELLANEOUS) ×1
BASKET HEART (ORDER IN 25'S) (MISCELLANEOUS) ×1
BASKET HEART (ORDER IN 25S) (MISCELLANEOUS) ×2 IMPLANT
BLADE STERNUM SYSTEM 6 (BLADE) ×4 IMPLANT
BLADE SURG 11 STRL SS (BLADE) ×4 IMPLANT
BLADE SURG 15 STRL LF DISP TIS (BLADE) ×2 IMPLANT
BLADE SURG 15 STRL SS (BLADE) ×2
BNDG ESMARK 6X9 LF (GAUZE/BANDAGES/DRESSINGS) ×4
BNDG GAUZE ELAST 4 BULKY (GAUZE/BANDAGES/DRESSINGS) ×4 IMPLANT
CANISTER SUCTION 2500CC (MISCELLANEOUS) ×4 IMPLANT
CANNULA AORTIC ROOT 9FR (CANNULA) ×4 IMPLANT
CANNULA ARTERIAL NVNT 3/8 22FR (MISCELLANEOUS) ×4 IMPLANT
CANNULA GUNDRY RCSP 15FR (MISCELLANEOUS) ×16 IMPLANT
CATH HEART VENT LEFT (CATHETERS) ×2 IMPLANT
CATH ROBINSON RED A/P 18FR (CATHETERS) ×16 IMPLANT
CATH THORACIC 28FR (CATHETERS) IMPLANT
CATH THORACIC 36FR (CATHETERS) ×4 IMPLANT
CATH THORACIC 36FR RT ANG (CATHETERS) ×4 IMPLANT
CAUTERY HIGH TEMP VAS (MISCELLANEOUS) ×4 IMPLANT
CAUTERY SURG HI TEMP FINE TIP (MISCELLANEOUS) ×4 IMPLANT
CLIP TI MEDIUM 24 (CLIP) IMPLANT
CLIP TI WIDE RED SMALL 24 (CLIP) IMPLANT
CONT SPEC 4OZ CLIKSEAL STRL BL (MISCELLANEOUS) ×8 IMPLANT
CONT SPEC STER OR (MISCELLANEOUS) ×4 IMPLANT
COVER MAYO STAND STRL (DRAPES) ×4 IMPLANT
COVER SURGICAL LIGHT HANDLE (MISCELLANEOUS) IMPLANT
CRADLE DONUT ADULT HEAD (MISCELLANEOUS) ×4 IMPLANT
DERMABOND ADVANCED (GAUZE/BANDAGES/DRESSINGS) ×2
DERMABOND ADVANCED .7 DNX12 (GAUZE/BANDAGES/DRESSINGS) ×2 IMPLANT
DRAPE CARDIOVASCULAR INCISE (DRAPES) ×2
DRAPE SLUSH/WARMER DISC (DRAPES) ×4 IMPLANT
DRAPE SRG 135X102X78XABS (DRAPES) ×2 IMPLANT
DRSG COVADERM 4X14 (GAUZE/BANDAGES/DRESSINGS) ×4 IMPLANT
DRSG VAC ATS LRG SENSATRAC (GAUZE/BANDAGES/DRESSINGS) ×4 IMPLANT
ELECT CAUTERY BLADE 6.4 (BLADE) ×4 IMPLANT
ELECT REM PT RETURN 9FT ADLT (ELECTROSURGICAL) ×8
ELECTRODE REM PT RTRN 9FT ADLT (ELECTROSURGICAL) ×4 IMPLANT
FELT TEFLON 1X6 (MISCELLANEOUS) ×8 IMPLANT
GAUZE SPONGE 4X4 12PLY STRL (GAUZE/BANDAGES/DRESSINGS) ×8 IMPLANT
GLOVE BIO SURGEON STRL SZ 6 (GLOVE) ×16 IMPLANT
GLOVE BIO SURGEON STRL SZ 6.5 (GLOVE) ×18 IMPLANT
GLOVE BIO SURGEON STRL SZ7 (GLOVE) IMPLANT
GLOVE BIO SURGEON STRL SZ7.5 (GLOVE) ×8 IMPLANT
GLOVE BIO SURGEONS STRL SZ 6.5 (GLOVE) ×6
GLOVE BIOGEL PI IND STRL 6 (GLOVE) IMPLANT
GLOVE BIOGEL PI IND STRL 6.5 (GLOVE) IMPLANT
GLOVE BIOGEL PI IND STRL 7.0 (GLOVE) IMPLANT
GLOVE BIOGEL PI INDICATOR 6 (GLOVE)
GLOVE BIOGEL PI INDICATOR 6.5 (GLOVE)
GLOVE BIOGEL PI INDICATOR 7.0 (GLOVE)
GLOVE EUDERMIC 7 POWDERFREE (GLOVE) ×8 IMPLANT
GLOVE ORTHO TXT STRL SZ7.5 (GLOVE) IMPLANT
GOWN STRL REUS W/ TWL LRG LVL3 (GOWN DISPOSABLE) ×16 IMPLANT
GOWN STRL REUS W/ TWL XL LVL3 (GOWN DISPOSABLE) ×2 IMPLANT
GOWN STRL REUS W/TWL LRG LVL3 (GOWN DISPOSABLE) ×16
GOWN STRL REUS W/TWL XL LVL3 (GOWN DISPOSABLE) ×2
GRAFT GELWEAVE VALSALVA 30CM (Prosthesis & Implant Heart) ×8 IMPLANT
GRAFT HEMASHIELD 28X40 (Vascular Products) ×4 IMPLANT
HEART VENT LT CURVED (MISCELLANEOUS) ×4 IMPLANT
HEMOSTAT POWDER SURGIFOAM 1G (HEMOSTASIS) ×12 IMPLANT
HEMOSTAT SURGICEL 2X14 (HEMOSTASIS) ×4 IMPLANT
INSERT FOGARTY 61MM (MISCELLANEOUS) IMPLANT
INSERT FOGARTY XLG (MISCELLANEOUS) IMPLANT
KIT BASIN OR (CUSTOM PROCEDURE TRAY) ×4 IMPLANT
KIT CATH CPB BARTLE (MISCELLANEOUS) ×4 IMPLANT
KIT ROOM TURNOVER OR (KITS) ×4 IMPLANT
KIT SUCTION CATH 14FR (SUCTIONS) ×4 IMPLANT
KIT VASOVIEW 6 PRO VH 2400 (KITS) ×4 IMPLANT
LINE VENT (MISCELLANEOUS) ×4 IMPLANT
LOOP VESSEL SUPERMAXI WHITE (MISCELLANEOUS) ×4 IMPLANT
NS IRRIG 1000ML POUR BTL (IV SOLUTION) ×32 IMPLANT
PACK OPEN HEART (CUSTOM PROCEDURE TRAY) ×4 IMPLANT
PAD ARMBOARD 7.5X6 YLW CONV (MISCELLANEOUS) ×8 IMPLANT
PAD ELECT DEFIB RADIOL ZOLL (MISCELLANEOUS) ×4 IMPLANT
PENCIL BUTTON HOLSTER BLD 10FT (ELECTRODE) ×4 IMPLANT
PUNCH AORTIC ROTATE 4.0MM (MISCELLANEOUS) IMPLANT
PUNCH AORTIC ROTATE 4.5MM 8IN (MISCELLANEOUS) ×4 IMPLANT
PUNCH AORTIC ROTATE 5MM 8IN (MISCELLANEOUS) IMPLANT
SEALANT SURG COSEAL 8ML (VASCULAR PRODUCTS) ×12 IMPLANT
SET CARDIOPLEGIA MPS 5001102 (MISCELLANEOUS) ×4 IMPLANT
SET VEIN GRAFT PERF (SET/KITS/TRAYS/PACK) ×4 IMPLANT
SOLUTION ANTI FOG 6CC (MISCELLANEOUS) ×4 IMPLANT
SPONGE INTESTINAL PEANUT (DISPOSABLE) IMPLANT
SPONGE LAP 18X18 X RAY DECT (DISPOSABLE) ×4 IMPLANT
SPONGE LAP 4X18 X RAY DECT (DISPOSABLE) ×8 IMPLANT
SUT BONE WAX W31G (SUTURE) ×4 IMPLANT
SUT ETHIBON 2 0 V 52N 30 (SUTURE) ×20 IMPLANT
SUT ETHIBON EXCEL 2-0 V-5 (SUTURE) IMPLANT
SUT ETHIBOND V-5 VALVE (SUTURE) IMPLANT
SUT MNCRL AB 4-0 PS2 18 (SUTURE) ×4 IMPLANT
SUT PROLENE 3 0 RB 1 (SUTURE) ×4 IMPLANT
SUT PROLENE 3 0 SH 1 (SUTURE) ×4 IMPLANT
SUT PROLENE 3 0 SH 48 (SUTURE) ×12 IMPLANT
SUT PROLENE 3 0 SH DA (SUTURE) ×4 IMPLANT
SUT PROLENE 3 0 SH1 36 (SUTURE) ×4 IMPLANT
SUT PROLENE 4 0 RB 1 (SUTURE) ×68
SUT PROLENE 4 0 SH DA (SUTURE) IMPLANT
SUT PROLENE 4-0 RB1 .5 CRCL 36 (SUTURE) ×68 IMPLANT
SUT PROLENE 5 0 C 1 36 (SUTURE) ×20 IMPLANT
SUT PROLENE 5 0 RB 2 (SUTURE) ×20 IMPLANT
SUT PROLENE 6 0 C 1 30 (SUTURE) ×12 IMPLANT
SUT PROLENE 7 0 BV 1 (SUTURE) IMPLANT
SUT PROLENE 7 0 BV1 MDA (SUTURE) ×4 IMPLANT
SUT PROLENE 8 0 BV175 6 (SUTURE) IMPLANT
SUT SILK  1 MH (SUTURE)
SUT SILK 1 MH (SUTURE) IMPLANT
SUT SILK 2 0 SH CR/8 (SUTURE) ×8 IMPLANT
SUT STEEL 6MS V (SUTURE) IMPLANT
SUT STEEL STERNAL CCS#1 18IN (SUTURE) IMPLANT
SUT STEEL SZ 6 DBL 3X14 BALL (SUTURE) IMPLANT
SUT VIC AB 1 CTX 36 (SUTURE) ×4
SUT VIC AB 1 CTX36XBRD ANBCTR (SUTURE) ×4 IMPLANT
SUT VIC AB 2-0 CT1 27 (SUTURE) ×2
SUT VIC AB 2-0 CT1 TAPERPNT 27 (SUTURE) ×2 IMPLANT
SUT VIC AB 2-0 CTX 27 (SUTURE) IMPLANT
SUT VIC AB 3-0 SH 27 (SUTURE)
SUT VIC AB 3-0 SH 27X BRD (SUTURE) IMPLANT
SUT VIC AB 3-0 X1 27 (SUTURE) IMPLANT
SUT VICRYL 4-0 PS2 18IN ABS (SUTURE) IMPLANT
SUTURE E-PAK OPEN HEART (SUTURE) ×4 IMPLANT
SYS ATRICLIP LAA EXCLUSION 40 (Clip) ×4 IMPLANT
SYSTEM SAHARA CHEST DRAIN ATS (WOUND CARE) ×4 IMPLANT
TAPE CLOTH SURG 4X10 WHT LF (GAUZE/BANDAGES/DRESSINGS) ×4 IMPLANT
TAPE PAPER 2X10 WHT MICROPORE (GAUZE/BANDAGES/DRESSINGS) ×4 IMPLANT
TOWEL OR 17X24 6PK STRL BLUE (TOWEL DISPOSABLE) ×4 IMPLANT
TOWEL OR 17X26 10 PK STRL BLUE (TOWEL DISPOSABLE) ×4 IMPLANT
TRAY FOLEY IC TEMP SENS 14FR (CATHETERS) IMPLANT
TRAY FOLEY IC TEMP SENS 16FR (CATHETERS) ×4 IMPLANT
TUBING INSUFFLATION (TUBING) ×4 IMPLANT
UNDERPAD 30X30 INCONTINENT (UNDERPADS AND DIAPERS) ×4 IMPLANT
VALVE MAGNA EASE AORTIC 27MM (Prosthesis & Implant Heart) ×4 IMPLANT
VENT LEFT HEART 12002 (CATHETERS) ×4
WATER STERILE IRR 1000ML POUR (IV SOLUTION) ×8 IMPLANT
YANKAUER SUCT BULB TIP NO VENT (SUCTIONS) ×4 IMPLANT

## 2015-12-13 NOTE — Progress Notes (Signed)
  Echocardiogram Echocardiogram Transesophageal has been performed.  Leta JunglingCooper, Maeby Vankleeck M 12/15/2015, 8:57 AM

## 2015-12-13 NOTE — Transfer of Care (Signed)
Immediate Anesthesia Transfer of Care Note  Patient: Sean Hardy  Procedure(s) Performed: Procedure(s): CORONARY ARTERY BYPASS GRAFTING x1  -SVG to RCA (N/A) CLIPPING OF ATRIAL APPENDAGE - Atricure 40 Clip (N/A) TRANSESOPHAGEAL ECHOCARDIOGRAM (TEE) (N/A) BENTALL PROCEDURE (N/A) THORACIC ASCENDING ANEURYSM REPAIR (AAA) (N/A)  Patient Location: SICU  Anesthesia Type:General  Level of Consciousness: Patient remains intubated per anesthesia plan  Airway & Oxygen Therapy: Patient remains intubated per anesthesia plan and Patient placed on Ventilator (see vital sign flow sheet for setting)  Post-op Assessment: Report given to RN, Post -op Vital signs reviewed and stable and SBP 80s and O2 sat 90, Dr. Laneta SimmersBartle at bedside.  Post vital signs: Reviewed and stable  Last Vitals:  Filed Vitals:   12/05/2015 0523 11/25/2015 0532  BP: 132/89 132/89  Pulse: 60 60  Temp:  36.3 C  Resp:  20    Last Pain:  Filed Vitals:   12/01/2015 0533  PainSc: 0-No pain         Complications: No apparent anesthesia complications

## 2015-12-13 NOTE — Anesthesia Procedure Notes (Addendum)
Procedure Name: Intubation Date/Time: 12/14/2015 7:48 AM Performed by: Coralee RudFLORES, Fernanda Twaddell Pre-anesthesia Checklist: Patient identified, Emergency Drugs available and Suction available Patient Re-evaluated:Patient Re-evaluated prior to inductionOxygen Delivery Method: Circle system utilized Preoxygenation: Pre-oxygenation with 100% oxygen Intubation Type: IV induction Ventilation: Oral airway inserted - appropriate to patient size Laryngoscope Size: Glidescope and 3 Grade View: Grade I Tube type: Subglottic suction tube Number of attempts: 1 Airway Equipment and Method: Rigid stylet Placement Confirmation: ETT inserted through vocal cords under direct vision,  positive ETCO2 and breath sounds checked- equal and bilateral Secured at: 24 cm Tube secured with: Tape Dental Injury: Teeth and Oropharynx as per pre-operative assessment     Central Venous Catheter Insertion Performed by: anesthesiologist 12/14/2015 6:45 AM Patient location: Pre-op. Preanesthetic checklist: patient identified, IV checked, site marked, risks and benefits discussed, surgical consent, monitors and equipment checked, pre-op evaluation, timeout performed and anesthesia consent Position: Trendelenburg Lidocaine 1% used for infiltration Landmarks identified Catheter size: 8.5 Fr PA cath was placed.MAC introducer Swan type and PA catheter depth:thermodilutionProcedure performed using ultrasound guided technique. Attempts: 1 Following insertion, line sutured, dressing applied and Biopatch. Post procedure assessment: blood return through all ports, free fluid flow and no air. Patient tolerated the procedure well with no immediate complications.

## 2015-12-13 NOTE — Brief Op Note (Addendum)
06-05-2015 - 12/24/2015  1:50 PM  PATIENT:  Sean Hardy  65 y.o. 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 Replacement of ascending aorta using a 28 mm Hemashield graft.  CORONARY ARTERY BYPASS GRAFTING x1  -SVG to RCA  ENDOSCOPIC HARVEST GREATER SAPHENOUS VEIN  -Right Thigh  CLIPPING OF ATRIAL APPENDAGE - Atricure 40 Clip  TRANSESOPHAGEAL ECHOCARDIOGRAM (TEE) (N/A)  SURGEON:  Surgeon(s) and Role:    * Alleen BorneBryan K Yarisa Lynam, MD - Primary  PHYSICIAN ASSISTANT: Erin Barrett PA-C  ANESTHESIA:   general  EBL:  Total I/O In: 2000 [I.V.:2000] Out: 225 [Urine:225]  BLOOD ADMINISTERED:1 U PRBC,  CELLSAVER and 1 PLTS  DRAINS: Mediastinal Chest Drains   LOCAL MEDICATIONS USED:  NONE  SPECIMEN:  Source of Specimen:  Ascending Aortic Aneurysm  DISPOSITION OF SPECIMEN:  PATHOLOGY  COUNTS:  YES  TOURNIQUET:  * No tourniquets in log *  DICTATION: .Dragon Dictation  PLAN OF CARE: Admit to inpatient   PATIENT DISPOSITION:  ICU - intubated and hemodynamically stable.   Delay start of Pharmacological VTE agent (>24hrs) due to surgical blood loss or risk of bleeding: yes

## 2015-12-13 NOTE — OR Nursing (Signed)
22:30 - 45 minute call to SICU

## 2015-12-14 ENCOUNTER — Encounter (HOSPITAL_COMMUNITY): Payer: Self-pay | Admitting: Surgery

## 2015-12-14 ENCOUNTER — Inpatient Hospital Stay (HOSPITAL_COMMUNITY): Payer: Medicare Other

## 2015-12-14 DIAGNOSIS — Z951 Presence of aortocoronary bypass graft: Secondary | ICD-10-CM

## 2015-12-14 LAB — POCT I-STAT 3, ART BLOOD GAS (G3+)
ACID-BASE DEFICIT: 8 mmol/L — AB (ref 0.0–2.0)
ACID-BASE DEFICIT: 6 mmol/L — AB (ref 0.0–2.0)
Acid-base deficit: 8 mmol/L — ABNORMAL HIGH (ref 0.0–2.0)
Acid-base deficit: 8 mmol/L — ABNORMAL HIGH (ref 0.0–2.0)
Acid-base deficit: 2 mmol/L (ref 0.0–2.0)
Acid-base deficit: 2 mmol/L (ref 0.0–2.0)
Bicarbonate: 19.5 mEq/L — ABNORMAL LOW (ref 20.0–24.0)
Bicarbonate: 17.6 mEq/L — ABNORMAL LOW (ref 20.0–24.0)
Bicarbonate: 19.2 mEq/L — ABNORMAL LOW (ref 20.0–24.0)
Bicarbonate: 19.5 mEq/L — ABNORMAL LOW (ref 20.0–24.0)
Bicarbonate: 22 mEq/L (ref 20.0–24.0)
Bicarbonate: 21.4 mEq/L (ref 20.0–24.0)
O2 SAT: 81 %
O2 SAT: 91 %
O2 SAT: 96 %
O2 Saturation: 99 %
O2 Saturation: 100 %
O2 Saturation: 98 %
PCO2 ART: 32.9 mmHg — AB (ref 35.0–45.0)
PCO2 ART: 35.5 mmHg (ref 35.0–45.0)
PCO2 ART: 45 mmHg (ref 35.0–45.0)
PCO2 ART: 46 mmHg — AB (ref 35.0–45.0)
PH ART: 7.241 — AB (ref 7.350–7.450)
PH ART: 7.421 (ref 7.350–7.450)
PO2 ART: 88 mmHg (ref 80.0–100.0)
Patient temperature: 36.1
Patient temperature: 97
Patient temperature: 36.9
Patient temperature: 97
Patient temperature: 37.3
TCO2: 19 mmol/L (ref 0–100)
TCO2: 20 mmol/L (ref 0–100)
TCO2: 21 mmol/L (ref 0–100)
TCO2: 21 mmol/L (ref 0–100)
TCO2: 22 mmol/L (ref 0–100)
TCO2: 23 mmol/L (ref 0–100)
pCO2 arterial: 33.7 mmHg — ABNORMAL LOW (ref 35.0–45.0)
pCO2 arterial: 33.4 mmHg — ABNORMAL LOW (ref 35.0–45.0)
pH, Arterial: 7.298 — ABNORMAL LOW (ref 7.350–7.450)
pH, Arterial: 7.23 — ABNORMAL LOW (ref 7.350–7.450)
pH, Arterial: 7.362 (ref 7.350–7.450)
pH, Arterial: 7.429 (ref 7.350–7.450)
pO2, Arterial: 69 mmHg — ABNORMAL LOW (ref 80.0–100.0)
pO2, Arterial: 162 mmHg — ABNORMAL HIGH (ref 80.0–100.0)
pO2, Arterial: 51 mmHg — ABNORMAL LOW (ref 80.0–100.0)
pO2, Arterial: 177 mmHg — ABNORMAL HIGH (ref 80.0–100.0)
pO2, Arterial: 95 mmHg (ref 80.0–100.0)

## 2015-12-14 LAB — BASIC METABOLIC PANEL
ANION GAP: 10 (ref 5–15)
Anion gap: 8 (ref 5–15)
BUN: 14 mg/dL (ref 6–20)
BUN: 17 mg/dL (ref 6–20)
CHLORIDE: 110 mmol/L (ref 101–111)
CO2: 20 mmol/L — ABNORMAL LOW (ref 22–32)
CO2: 22 mmol/L (ref 22–32)
CREATININE: 1.37 mg/dL — AB (ref 0.61–1.24)
Calcium: 8 mg/dL — ABNORMAL LOW (ref 8.9–10.3)
Calcium: 7.8 mg/dL — ABNORMAL LOW (ref 8.9–10.3)
Chloride: 109 mmol/L (ref 101–111)
Creatinine, Ser: 1.43 mg/dL — ABNORMAL HIGH (ref 0.61–1.24)
GFR calc Af Amer: 58 mL/min — ABNORMAL LOW (ref >60)
GFR calc Af Amer: >60 mL/min (ref >60)
GFR, EST NON AFRICAN AMERICAN: 50 mL/min — AB (ref >60)
GFR, EST NON AFRICAN AMERICAN: 53 mL/min — AB (ref >60)
GLUCOSE: 114 mg/dL — AB (ref 65–99)
Glucose, Bld: 207 mg/dL — ABNORMAL HIGH (ref 65–99)
POTASSIUM: 3.7 mmol/L (ref 3.5–5.1)
POTASSIUM: 3.9 mmol/L (ref 3.5–5.1)
SODIUM: 140 mmol/L (ref 135–145)
SODIUM: 139 mmol/L (ref 135–145)

## 2015-12-14 LAB — GLUCOSE, CAPILLARY
GLUCOSE-CAPILLARY: 138 mg/dL — AB (ref 65–99)
GLUCOSE-CAPILLARY: 150 mg/dL — AB (ref 65–99)
GLUCOSE-CAPILLARY: 156 mg/dL — AB (ref 65–99)
GLUCOSE-CAPILLARY: 173 mg/dL — AB (ref 65–99)
GLUCOSE-CAPILLARY: 179 mg/dL — AB (ref 65–99)
GLUCOSE-CAPILLARY: 191 mg/dL — AB (ref 65–99)
GLUCOSE-CAPILLARY: 194 mg/dL — AB (ref 65–99)
Glucose-Capillary: 184 mg/dL — ABNORMAL HIGH (ref 65–99)
Glucose-Capillary: 197 mg/dL — ABNORMAL HIGH (ref 65–99)
Glucose-Capillary: 211 mg/dL — ABNORMAL HIGH (ref 65–99)
Glucose-Capillary: 187 mg/dL — ABNORMAL HIGH (ref 65–99)
Glucose-Capillary: 182 mg/dL — ABNORMAL HIGH (ref 65–99)
Glucose-Capillary: 175 mg/dL — ABNORMAL HIGH (ref 65–99)
Glucose-Capillary: 158 mg/dL — ABNORMAL HIGH (ref 65–99)
Glucose-Capillary: 129 mg/dL — ABNORMAL HIGH (ref 65–99)
Glucose-Capillary: 128 mg/dL — ABNORMAL HIGH (ref 65–99)
Glucose-Capillary: 110 mg/dL — ABNORMAL HIGH (ref 65–99)
Glucose-Capillary: 99 mg/dL (ref 65–99)

## 2015-12-14 LAB — CBC
HEMATOCRIT: 30.8 % — AB (ref 39.0–52.0)
HEMATOCRIT: 34.6 % — AB (ref 39.0–52.0)
HEMATOCRIT: 38.2 % — AB (ref 39.0–52.0)
HEMOGLOBIN: 10.8 g/dL — AB (ref 13.0–17.0)
HEMOGLOBIN: 11.5 g/dL — AB (ref 13.0–17.0)
HEMOGLOBIN: 12.8 g/dL — AB (ref 13.0–17.0)
MCH: 30.9 pg (ref 26.0–34.0)
MCH: 31.4 pg (ref 26.0–34.0)
MCH: 31.7 pg (ref 26.0–34.0)
MCHC: 33.2 g/dL (ref 30.0–36.0)
MCHC: 33.5 g/dL (ref 30.0–36.0)
MCHC: 35.1 g/dL (ref 30.0–36.0)
MCV: 90.3 fL (ref 78.0–100.0)
MCV: 93 fL (ref 78.0–100.0)
MCV: 93.6 fL (ref 78.0–100.0)
Platelets: 118 10*3/uL — ABNORMAL LOW (ref 150–400)
Platelets: 120 10*3/uL — ABNORMAL LOW (ref 150–400)
Platelets: 81 10*3/uL — ABNORMAL LOW (ref 150–400)
RBC: 3.41 MIL/uL — AB (ref 4.22–5.81)
RBC: 3.72 MIL/uL — AB (ref 4.22–5.81)
RBC: 4.08 MIL/uL — ABNORMAL LOW (ref 4.22–5.81)
RDW: 15 % (ref 11.5–15.5)
RDW: 16.1 % — ABNORMAL HIGH (ref 11.5–15.5)
RDW: 16.6 % — ABNORMAL HIGH (ref 11.5–15.5)
WBC: 13.5 10*3/uL — ABNORMAL HIGH (ref 4.0–10.5)
WBC: 18.5 10*3/uL — ABNORMAL HIGH (ref 4.0–10.5)
WBC: 21.9 10*3/uL — ABNORMAL HIGH (ref 4.0–10.5)

## 2015-12-14 LAB — POCT I-STAT, CHEM 8
BUN: 18 mg/dL (ref 6–20)
Calcium, Ion: 1.19 mmol/L (ref 1.12–1.23)
Chloride: 107 mmol/L (ref 101–111)
Creatinine, Ser: 1.3 mg/dL — ABNORMAL HIGH (ref 0.61–1.24)
Glucose, Bld: 115 mg/dL — ABNORMAL HIGH (ref 65–99)
HEMATOCRIT: 32 % — AB (ref 39.0–52.0)
HEMOGLOBIN: 10.9 g/dL — AB (ref 13.0–17.0)
POTASSIUM: 3.9 mmol/L (ref 3.5–5.1)
SODIUM: 138 mmol/L (ref 135–145)
TCO2: 22 mmol/L (ref 0–100)

## 2015-12-14 LAB — PREPARE PLATELET PHERESIS
Unit division: 0
Unit division: 0

## 2015-12-14 LAB — PREPARE CRYOPRECIPITATE
UNIT DIVISION: 0
Unit division: 0

## 2015-12-14 LAB — PREPARE FRESH FROZEN PLASMA
UNIT DIVISION: 0
Unit division: 0

## 2015-12-14 LAB — MAGNESIUM
MAGNESIUM: 2.9 mg/dL — AB (ref 1.7–2.4)
Magnesium: 2.4 mg/dL (ref 1.7–2.4)

## 2015-12-14 LAB — POCT I-STAT 4, (NA,K, GLUC, HGB,HCT)
Glucose, Bld: 140 mg/dL — ABNORMAL HIGH (ref 65–99)
HEMATOCRIT: 35 % — AB (ref 39.0–52.0)
HEMOGLOBIN: 11.9 g/dL — AB (ref 13.0–17.0)
Potassium: 4.8 mmol/L (ref 3.5–5.1)
Sodium: 141 mmol/L (ref 135–145)

## 2015-12-14 LAB — PROTIME-INR
INR: 1.95 — AB (ref 0.00–1.49)
Prothrombin Time: 22.1 seconds — ABNORMAL HIGH (ref 11.6–15.2)

## 2015-12-14 LAB — APTT: aPTT: 42 seconds — ABNORMAL HIGH (ref 24–37)

## 2015-12-14 MED ORDER — SODIUM BICARBONATE 8.4 % IV SOLN
50.0000 meq | Freq: Once | INTRAVENOUS | Status: AC
  Administered 2015-12-14: 50 meq via INTRAVENOUS

## 2015-12-14 MED ORDER — FAMOTIDINE IN NACL 20-0.9 MG/50ML-% IV SOLN
20.0000 mg | Freq: Two times a day (BID) | INTRAVENOUS | Status: AC
  Administered 2015-12-14 (×2): 20 mg via INTRAVENOUS
  Filled 2015-12-14: qty 50

## 2015-12-14 MED ORDER — LEVOFLOXACIN IN D5W 750 MG/150ML IV SOLN
750.0000 mg | INTRAVENOUS | Status: AC
  Administered 2015-12-14: 750 mg via INTRAVENOUS
  Filled 2015-12-14: qty 150

## 2015-12-14 MED ORDER — DOPAMINE-DEXTROSE 3.2-5 MG/ML-% IV SOLN
0.0000 ug/kg/min | INTRAVENOUS | Status: DC
  Administered 2015-12-14 – 2015-12-15 (×3): 7 ug/kg/min via INTRAVENOUS
  Filled 2015-12-14 (×3): qty 250

## 2015-12-14 MED ORDER — SODIUM CHLORIDE 0.9 % IV SOLN
250.0000 mL | INTRAVENOUS | Status: DC
Start: 2015-12-14 — End: 2015-12-16

## 2015-12-14 MED ORDER — NOREPINEPHRINE BITARTRATE 1 MG/ML IV SOLN
0.0000 ug/min | INTRAVENOUS | Status: DC
  Administered 2015-12-14: 16 ug/min via INTRAVENOUS
  Administered 2015-12-14: 15 ug/min via INTRAVENOUS
  Administered 2015-12-14: 12 ug/min via INTRAVENOUS
  Administered 2015-12-14: 10 ug/min via INTRAVENOUS
  Administered 2015-12-14: 11 ug/min via INTRAVENOUS
  Administered 2015-12-15 (×3): 16 ug/min via INTRAVENOUS
  Administered 2015-12-15: 8 ug/min via INTRAVENOUS
  Filled 2015-12-14 (×11): qty 4

## 2015-12-14 MED ORDER — SODIUM CHLORIDE 0.9% FLUSH: 3.0000 mL | INTRAVENOUS | Status: DC | PRN

## 2015-12-14 MED ORDER — PANTOPRAZOLE SODIUM 40 MG PO TBEC: 40.0000 mg | DELAYED_RELEASE_TABLET | Freq: Every day | ORAL | Status: DC

## 2015-12-14 MED ORDER — ACETAMINOPHEN 650 MG RE SUPP
650.0000 mg | Freq: Once | RECTAL | Status: AC
  Administered 2015-12-14: 650 mg via RECTAL

## 2015-12-14 MED ORDER — LACTATED RINGERS IV SOLN: INTRAVENOUS | Status: DC

## 2015-12-14 MED ORDER — CHLORHEXIDINE GLUCONATE 0.12% ORAL RINSE (MEDLINE KIT)
15.0000 mL | Freq: Two times a day (BID) | OROMUCOSAL | Status: DC
  Administered 2015-12-14 – 2015-12-15 (×4): 15 mL via OROMUCOSAL

## 2015-12-14 MED ORDER — BISACODYL 5 MG PO TBEC: 10.0000 mg | DELAYED_RELEASE_TABLET | Freq: Every day | ORAL | Status: DC

## 2015-12-14 MED ORDER — NITROGLYCERIN IN D5W 200-5 MCG/ML-% IV SOLN: 0.0000 ug/min | INTRAVENOUS | Status: DC

## 2015-12-14 MED ORDER — MORPHINE SULFATE (PF) 2 MG/ML IV SOLN
2.0000 mg | INTRAVENOUS | Status: DC | PRN
  Administered 2015-12-14 (×2): 2 mg via INTRAVENOUS
  Administered 2015-12-14 (×2): 4 mg via INTRAVENOUS
  Administered 2015-12-15: 2 mg via INTRAVENOUS
  Administered 2015-12-15: 4 mg via INTRAVENOUS
  Filled 2015-12-14 (×3): qty 2
  Filled 2015-12-14: qty 1
  Filled 2015-12-14 (×2): qty 2
  Filled 2015-12-14: qty 1

## 2015-12-14 MED ORDER — PHENYLEPHRINE HCL 10 MG/ML IJ SOLN
0.0000 ug/min | INTRAVENOUS | Status: DC
  Administered 2015-12-14: 50 ug/min via INTRAVENOUS
  Administered 2015-12-14 (×3): 60 ug/min via INTRAVENOUS
  Filled 2015-12-14 (×4): qty 2

## 2015-12-14 MED ORDER — ANTISEPTIC ORAL RINSE SOLUTION (CORINZ)
7.0000 mL | Freq: Four times a day (QID) | OROMUCOSAL | Status: DC
  Administered 2015-12-14 – 2015-12-15 (×8): 7 mL via OROMUCOSAL

## 2015-12-14 MED ORDER — SODIUM CHLORIDE 0.9 % IV SOLN
INTRAVENOUS | Status: DC
  Administered 2015-12-14: 6.6 [IU]/h via INTRAVENOUS
  Administered 2015-12-14: 2.4 [IU]/h via INTRAVENOUS
  Administered 2015-12-15: 4.8 [IU]/h via INTRAVENOUS
  Filled 2015-12-14 (×4): qty 2.5

## 2015-12-14 MED ORDER — INSULIN REGULAR BOLUS VIA INFUSION
0.0000 [IU] | Freq: Three times a day (TID) | INTRAVENOUS | Status: DC
  Filled 2015-12-14: qty 10

## 2015-12-14 MED ORDER — POTASSIUM CHLORIDE 10 MEQ/50ML IV SOLN
10.0000 meq | INTRAVENOUS | Status: AC
  Administered 2015-12-14 (×2): 10 meq via INTRAVENOUS

## 2015-12-14 MED ORDER — ACETAMINOPHEN 160 MG/5ML PO SOLN
1000.0000 mg | Freq: Four times a day (QID) | ORAL | Status: DC
  Administered 2015-12-14 – 2015-12-15 (×4): 1000 mg
  Filled 2015-12-14 (×4): qty 40.6

## 2015-12-14 MED ORDER — SODIUM CHLORIDE 0.45 % IV SOLN
INTRAVENOUS | Status: DC | PRN
  Administered 2015-12-15: 10 mL/h via INTRAVENOUS

## 2015-12-14 MED ORDER — MORPHINE SULFATE (PF) 2 MG/ML IV SOLN: 1.0000 mg | INTRAVENOUS | Status: AC | PRN

## 2015-12-14 MED ORDER — ALBUMIN HUMAN 5 % IV SOLN
250.0000 mL | INTRAVENOUS | Status: AC | PRN
  Administered 2015-12-14: 250 mL via INTRAVENOUS

## 2015-12-14 MED ORDER — MIDAZOLAM HCL 2 MG/2ML IJ SOLN
2.0000 mg | INTRAMUSCULAR | Status: DC | PRN
  Administered 2015-12-14 – 2015-12-15 (×6): 2 mg via INTRAVENOUS
  Administered 2015-12-15: 1 mg via INTRAVENOUS
  Administered 2015-12-15 (×2): 2 mg via INTRAVENOUS
  Administered 2015-12-15: 1 mg via INTRAVENOUS
  Filled 2015-12-14 (×9): qty 2

## 2015-12-14 MED ORDER — SODIUM BICARBONATE 8.4 % IV SOLN
50.0000 meq | Freq: Once | INTRAVENOUS | Status: AC
  Administered 2015-12-14: 50 meq via INTRAVENOUS
  Filled 2015-12-14: qty 50

## 2015-12-14 MED ORDER — METOPROLOL TARTRATE 25 MG/10 ML ORAL SUSPENSION: 12.5000 mg | Freq: Two times a day (BID) | ORAL | Status: DC

## 2015-12-14 MED ORDER — LACTATED RINGERS IV SOLN
INTRAVENOUS | Status: DC
  Administered 2015-12-14: 20 mL/h via INTRAVENOUS

## 2015-12-14 MED ORDER — ACETAMINOPHEN 160 MG/5ML PO SOLN: 650.0000 mg | Freq: Once | ORAL | Status: AC

## 2015-12-14 MED ORDER — METOCLOPRAMIDE HCL 5 MG/ML IJ SOLN
10.0000 mg | Freq: Four times a day (QID) | INTRAMUSCULAR | Status: AC
  Administered 2015-12-14 (×4): 10 mg via INTRAVENOUS
  Filled 2015-12-14 (×3): qty 2

## 2015-12-14 MED ORDER — SODIUM CHLORIDE 0.9% FLUSH
3.0000 mL | Freq: Two times a day (BID) | INTRAVENOUS | Status: DC
  Administered 2015-12-14 – 2015-12-15 (×4): 3 mL via INTRAVENOUS

## 2015-12-14 MED ORDER — TRAMADOL HCL 50 MG PO TABS: 50.0000 mg | ORAL_TABLET | ORAL | Status: DC | PRN

## 2015-12-14 MED ORDER — BISACODYL 10 MG RE SUPP: 10.0000 mg | Freq: Every day | RECTAL | Status: DC

## 2015-12-14 MED ORDER — LACTATED RINGERS IV SOLN: 500.0000 mL | Freq: Once | INTRAVENOUS | Status: DC | PRN

## 2015-12-14 MED ORDER — ACETAMINOPHEN 500 MG PO TABS: 1000.0000 mg | ORAL_TABLET | Freq: Four times a day (QID) | ORAL | Status: DC

## 2015-12-14 MED ORDER — CHLORHEXIDINE GLUCONATE 0.12 % MT SOLN
15.0000 mL | OROMUCOSAL | Status: AC
  Administered 2015-12-14: 15 mL via OROMUCOSAL

## 2015-12-14 MED ORDER — METOPROLOL TARTRATE 5 MG/5ML IV SOLN: 2.5000 mg | INTRAVENOUS | Status: DC | PRN

## 2015-12-14 MED ORDER — POTASSIUM CHLORIDE 10 MEQ/50ML IV SOLN
10.0000 meq | INTRAVENOUS | Status: DC | PRN
  Administered 2015-12-14: 10 meq via INTRAVENOUS

## 2015-12-14 MED ORDER — METOPROLOL TARTRATE 12.5 MG HALF TABLET: 12.5000 mg | ORAL_TABLET | Freq: Two times a day (BID) | ORAL | Status: DC

## 2015-12-14 MED ORDER — DEXMEDETOMIDINE HCL IN NACL 400 MCG/100ML IV SOLN
0.0000 ug/kg/h | INTRAVENOUS | Status: DC
  Administered 2015-12-14 – 2015-12-15 (×4): 1.2 ug/kg/h via INTRAVENOUS
  Filled 2015-12-14 (×5): qty 100

## 2015-12-14 MED ORDER — ASPIRIN 81 MG PO CHEW
324.0000 mg | CHEWABLE_TABLET | Freq: Every day | ORAL | Status: DC
  Administered 2015-12-14: 324 mg
  Filled 2015-12-14: qty 4

## 2015-12-14 MED ORDER — ASPIRIN EC 325 MG PO TBEC: 325.0000 mg | DELAYED_RELEASE_TABLET | Freq: Every day | ORAL | Status: DC

## 2015-12-14 MED ORDER — OXYCODONE HCL 5 MG PO TABS: 5.0000 mg | ORAL_TABLET | ORAL | Status: DC | PRN

## 2015-12-14 MED ORDER — DEXMEDETOMIDINE HCL IN NACL 400 MCG/100ML IV SOLN
0.0000 ug/kg/h | INTRAVENOUS | Status: DC
  Administered 2015-12-14 (×3): 0.5 ug/kg/h via INTRAVENOUS
  Administered 2015-12-14 (×2): 0.7 ug/kg/h via INTRAVENOUS
  Filled 2015-12-14 (×6): qty 100

## 2015-12-14 MED ORDER — POTASSIUM CHLORIDE 10 MEQ/50ML IV SOLN: 10.0000 meq | INTRAVENOUS | Status: AC

## 2015-12-14 MED ORDER — ONDANSETRON HCL 4 MG/2ML IJ SOLN: 4.0000 mg | Freq: Four times a day (QID) | INTRAMUSCULAR | Status: DC | PRN

## 2015-12-14 MED ORDER — VANCOMYCIN HCL IN DEXTROSE 1-5 GM/200ML-% IV SOLN
1000.0000 mg | Freq: Once | INTRAVENOUS | Status: AC
  Administered 2015-12-14: 1000 mg via INTRAVENOUS
  Filled 2015-12-14: qty 200

## 2015-12-14 MED ORDER — DOCUSATE SODIUM 100 MG PO CAPS: 200.0000 mg | ORAL_CAPSULE | Freq: Every day | ORAL | Status: DC

## 2015-12-14 MED ORDER — MAGNESIUM SULFATE 4 GM/100ML IV SOLN
4.0000 g | Freq: Once | INTRAVENOUS | Status: AC
  Administered 2015-12-14: 4 g via INTRAVENOUS
  Filled 2015-12-14: qty 100

## 2015-12-14 MED ORDER — SODIUM CHLORIDE 0.9 % IV SOLN: INTRAVENOUS | Status: DC

## 2015-12-14 NOTE — Op Note (Signed)
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 Median Sternotomy 2. Extracorporeal circulation 3.   Replacement of the ascending aorta using a 28 mm Hemashield graft under deep hypothermic circulatory arrest 4.   Biological Bentall Procedure using a 27 mm Edwards Magna-Ease pericardial valve and a 30 mm Gelweave Valsalva graft.  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 He has a state appointed guardian who is with him today. He was seen by Dr. Abelardo Diesel of [facility] on 08/21/2015 but I am not sure if that was his first encounter. He had a history of a thoracic aneurysm so a CTA of the chest was done at [facility] on 08/30/2015. 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 The ascending aorta was 4.3 cm and was unchanged. The transverse arch was 3.2 cm and unchanged and the descending aorta was 3.3 cm and unchanged. There were coronary artery calcifications. I saw him in consultation on 10/14/2015 and felt that surgical replacement of his aortic root aneurysm was indicated. He was seen by cardiology and underwent an echo showing an EF of 50-55% with moderate LVH. There was a trileaflet aortic valve with trivial AI, trivial MR. Cardiac cath showed a 90% proximal  RCA stenosis. The proximal LAD had 30% narrowing within the previously placed stent. The LCX had 40% proximal stenosis. He has occasional chest pain with exertion.  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 His aortic valve is difficult to see in detail on 2D echo due to his body size but there is only trivial AI and I think a valve sparing root replacement may be possible. If if is not then I would use a pericardial valved conduit. He would also need a bypass to the distal RCA and with his history of atrial fib/flutter and prior stroke I would clip the LAA. I reviewed the studies with the patient and his guardian and all questions were answered. I discussed the operative procedure with the patient and his guardian from social services including alternatives, benefits and risks; including but not limited to bleeding, blood transfusion, infection, stroke, myocardial infarction, graft failure, heart block requiring a permanent pacemaker, organ dysfunction, and death. Sean Hardy understands and agrees to proceed.   Preparation:  The patient was seen in the preoperative holding area and the correct patient, correct operation were confirmed with the patient after reviewing the medical record and catheterization. The consent was signed by me. Preoperative antibiotics were given. A pulmonary arterial line and radial arterial line were placed by the anesthesia team. The patient was taken back to the operating room and positioned supine on the operating room table. After being placed under general endotracheal anesthesia by the anesthesia team a foley catheter was placed. The neck, chest, abdomen, and both legs were prepped with betadine  soap and solution and draped in the usual sterile manner. A surgical time-out was taken and the correct patient and operative procedure were confirmed with the nursing and anesthesia staff.  TEE:  Performed by Dr. Judie Petit. This showed trivial central  AI, normal LV function.   Cardiopulmonary Bypass:  A median sternotomy was performed. The pericardium was opened in the midline. Right ventricular function appeared normal. The ascending aorta was enlarged at the sinotubular junction and gradually decreased to normal size just proximal to the innominate artery and had no palpable plaque. The aortic root was aneurysmal. There were no contraindications to aortic cannulation or cross-clamping. The patient was fully systemically heparinized and the ACT was maintained > 400 sec. The distal ascending aorta was cannulated with a 22 F aortic cannula for arterial inflow. Venous cannulation was performed via the right atrial appendage using a two-staged venous cannula.  Systemic cooling to 18 degrees Centigrade and topical cooling of the heart with iced saline were used. Hyperkalemic retrograde cold blood cardioplegia was used to induce diastolic arrest and was then given both into the coronary ostia using a handheld cannula and retrograde at about 20 minute intervals throughout the period of arrest to maintain myocardial temperature at or below 10 degrees centigrade. A temperature probe was inserted into the interventricular septum and an insulating pad was placed in the pericardium. CO2 was insufflated into the pericardium throughout the case to minimize intracardiac air.  Clipping of Left atrial Appendage:   The base of the appendage was measured and a small 40 mm Atricure Atriclip mitral clip was chosen. This was placed across the base of the LAA without difficulty.    Endoscopic vein harvest:  The right greater saphenous vein was harvested endoscopically through a 2 cm incision medial to the right knee. It was harvested from the thigh. It was a medium-sized vein of fair quality. The side branches were all ligated with 4-0 silk ties.   Coronary artery Bypass:  Coronary arteries:  The coronary arteries were examined.   LAD:  Diffuse calcific  plaque  LCX:  Diffuse calcific plaque  RCA:  Diffuse calcific plaque proximally extending to mid vessel. Just before the PDA branch the vessel was suitable for grafting.   Grafts:  1. SVG to RCA:  3.0 mm. It was sewn end to side using 7-0 prolene continuous suture.  The proximal vein graft anastomosis were performed to the aortic graft using continuous 5-0 prolene suture.     Resection and grafting of ascending aortic aneurysm:  The patient was placed on cardiopulmonary bypass and a left ventricular vent was placed via the right superior pulmonary vein. Systemic cooling was begun with a goal temperature of 18 degrees centigrade by bladder and rectal temperature probes. A retrograde cardioplegia cannula was placed through the right atrium into the coronary sinus without difficulty. A retrograde cerebral perfusion cannula was placed into the SVC through a pursestring suture and the SVC was encircled with a silastic tape. As soon as the heart fibrillated the PA pressures immediately increased to high levels due to the severe AI and the LV vent on high could not evacuate enough blood to decrease the PA pressure. Therefore the aorta was cross-clamped and retrograde cardioplegia was given. While the patient was cooling I transected the aorta and started removing the coronary buttons and valve as noted below.  After 30 minutes of cooling the target temperature of 18 degrees centigrade was reached. Cerebral oximetry was 70% bilaterally. BIS was zero. The  patient was given  Etomidate and 125 mg of Solumedrol by anesthesia. The head was packed in ice. The bed was placed in steep trendelenburg. Circulatory arrest was begun and the blood volume emptied into the venous reservoir. Continuous retrograde cerebraplegia was begun and the SVC occluded with the silastic tape. Cold blood retrograde cardioplegia and direct coronary ostial cardioplegia were given and myocardial temperature dropped to 10 degrees  centigrade. Additional doses were given at approximately 20 minute intervals throughout the period of circulatory arrest and cross-clamping. Complete diastolic arrest was maintained. The aortic cannula was removed. The aorta was transected just proximal to the innominate artery. The aortic diameter was measured at 28 mm here. A 28 x 10 mm Hemasheild Platinum vascular graft was prepared. ( Catalog # [id number] [place], [place] [id number]). It was anastomosed to the aortic arch in an end to end manner using 3-0 prolene continuous suture with a felt strip to reinforce the anastomisis. A light coating of CoSeal was applied to seal needle holes. The arterial end of the bypass circuit was then connected to the 10mm side arm graft and circulation was slowly resumed. The tape was removed from the SVC. The aortic graft was cross-clamped proximal to the side arm graft and full CPB support was resumed. Circulatory arrest time was 21 minutes. Retrograde cerebral perfusion time was 20 minutes.   Valve sparing root replacement:   The ascending aorta was mobilized from the right pulmonary artery and main PA. It was opened longitudinally to a point 2 cm above the STJ and the valve inspected. There were 3 leaflets that were thin and pliable. There was no calcium. There did not appear to be any prolapse. It appeared that the insufficiency was due to annular dilatation. There was one small fenestration present at the commissure between the right and non-coronary cusps. The right and left coronary arteries were removed from the aortic root with a button of aortic Emmerich around the ostia. They were retracted carefully out of the way with stay sutures to prevent rotation. Stay sutures were placed at each commissure. The plane between the aorta and the surrounding structures was dissected using electrocautery down to level just below the nadir of the valve cusps. The excess aortic sinus tissue was excised leaving a 5 mm rim above the annulus. A 2-0  Ethibond horizontal mattress suture was placed below the nadir of each cusp from the ventricular side to outside the aorta. 3 additional sutures were placed beneath each commissure so that all 6 sutures were in the same plane. The optimal STJ diameter was determined using a valve sizer and a 28 mm Gelweave Valsalva graft chosen.The proximal skirt of the graft was shortened to 3 rings. The 6 sub-annular sutures were placed through the skirt at the proper position. The commissures were brought inside the graft and the graft lowered into place. The sub-annular sutures were tied. The position of the commissures were determined and they were held in place using pledgetted 4-0 prolene mattress sutures through the graft from inside to outside. Then the distal valve suture line was performed using 4-0 prolene sutures beginning at the nadir of each cusp and tieing the sutures together at the top of the commissures outside the graft. This resulted in a " Mercedes star" appearance of the valve which appeared completely competent. Small openings were made in the graft for the coronary anastomoses using a thermal cautery. Then the left and right coronary buttons were anastomosed to the graft in an end to side manner  using continuous 5-0 prolene suture. A light coating of CoSeal was applied to each anastomosis for hemostasis. The two grafts were then cut to the appropriate length and anastomosed end to end using continuous 3-0 prolene suture. CoSeal was applied to seal the needle holes in the grafts. The proximal and distal aortic grafts were then cut to the appropriate length and anastomosed using continuous 4-0 prolene suture. A vent cannula was placed into the graft to remove any air. Deairing maneuvers were performed and the bed placed in trendelenburg position.  The cross-clamp was removed and there was return of sinus bradycardia. Right atrial and right ventricular pacing wires were placed. The patient was paced in an AV  sequential mode. The heart was filled with blood to evaluate the aortic valve and there was only trivial AI but suddenly there was a large amount of bleeding from the proximal suture line which I assumed was due to some disruption at the distal hemostatic suture line. I decided to arrest the heart again and open the graft to examine the suture line. The graft was cross-clamped and another dose of retrograde cardioplegia was given to stop the heart. The patient was cooled to 32 degrees. The aortic graft anastomosis was opened and the distal hemostatic suture line was examined. I did not see any obvious site of disruption so I thought the bleeding must be coming from some leak between the sutures. I decided to run another 4-0 prolene suture around the distal hemostatic suture line to reinforce it. The aortic grafts were again anastomosed in the same manner and the cross-clamp removed. There was still significant bleeding from this same area. I cross-clamped again and gave cardioplegia to stop the heart and tried to repair the specific area that was most likely bleeding but this did not resolve it. At this point I felt that the only option was to take down the repair and perform a Bentall procedure. Therefore the patient was cooled to 28 degrees and the aortic graft clamped. Cold blood retrograde cardioplegia was given every 20 minutes as well as cardioplegia down the RCA vein graft. The valve repair was taken down and all of the sutures removed. As this was carefully performed it became apparent that the bleeding was coming from a tear in the aortic Quebedeaux anteriorly along the hemostatic suture line adjacent to the annulus. The valve leaflets were excised.   Bentall Procedure:     The annulus was sized and a 27 mm Edwards Magna-Ease pericardial valve was chosen. ( Model # [id number], Serial # [id number]). A series of pledgetted 2-0 Ethibond horizontal mattress sutures were placed around the annulus with the pledgets in a  sub-annular position. A 30 mm Gelweave Valsalva graft was chosen.The proximal cuff was trimmed so that there were 3 rings remaining. The valve was placed in the graft so that the sewing cuff was adjacent to the proximal graft cuff. The commissural posts were lined up with the vertical marker sutures on the graft and the valve and graft were held in position using a 5-0 prolene suture at each commissure. The sutures were placed through a strip of autologous pericardium to reinforce the annulus and then the valve sewing ring followed by the proximal graft cuff. The valve was lowered into place and the sutures tied . The valve seated nicely.  Small openings were made in the graft for the coronary anastomoses using a thermal cautery. Then the left and right coronary buttons were anastomosed to the graft in an  end to side manner using continuous 5-0 prolene suture. A light coating of CoSeal was applied to each anastomosis for hemostasis. The two grafts were then cut to the appropriate length and anastomosed end to end using continuous 3-0 prolene suture. CoSeal was applied to seal the needle holes in the grafts. A vent cannula was placed into the graft to remove any air. Deairing maneuvers were performed and the bed placed in trendelenburg position.   Completion:   The patient was rewarmed to 37 degrees Centigrade. The crossclamp was removed. There was no spontaneous rhythm and the patient was paced in AV sequential manner. The position of the grafts was satisfactory. The vascular anastomoses all appeared hemostatic. Two temporary epicardial pacing wires were placed on the right atrium and two on the right ventricle. The patient was weaned from CPB without difficulty on dopamine 5, milrinone 0.3, levophed and neo.  Cardiac output was 4-5 LPM. TEE showed a normal functioning aortic valve prosthesis with no AI.  LV function appeared normal. The RV was hypokinetic. We had difficulty maintaining adequate oxygen  saturations which were in the low 80's but gradually improved to about 90% with ventilator manipulation. Heparin was fully reversed with protamine and the aortic and venous cannulas removed. Hemostasis was achieved. Mediastinal  drainage tubes were placed. He remained hemodynamically labile with some RV dysfunction that I felt was most likely due to the long surgery and his oxygen sats were only around 88-90%. I did not think the sternum should be closed so I sewed an Esmark rubber sheet around the skin edges. A VAC was placed over this. All sponge, needle, and instrument counts were reported correct at the end of the case. Dry sterile dressings were placed over the incisions and around the chest tubes which were connected to pleurevac suction. The patient was then transported to the surgical intensive care unit in critical but stable condition.

## 2015-12-14 NOTE — Progress Notes (Signed)
Initial Nutrition Assessment  DOCUMENTATION CODES:   Morbid obesity  INTERVENTION:   If TF started, recommend initiation of Vital High Protein formula at goal rate of 60 ml/h (1440 ml per day) and Prostat 60 ml TID to provide 2040 kcals, 216 gm protein, 1204 ml free water daily.  NUTRITION DIAGNOSIS:   Inadequate oral intake related to inability to eat as evidenced by NPO status  GOAL:   Provide needs based on ASPEN/SCCM guidelines  MONITOR:   Vent status, Labs, Weight trends, Skin, I & O's  REASON FOR ASSESSMENT:   Ventilator  ASSESSMENT:   65 yo Male with PMH of HTN, CAD, right thalamic stroke with left hemiparesis, atrial fibrillation/flutter and a long history of decreased mental functioning who is [facility] a [facility] and [facility] [place] [facility]; admitted for aortic root aneurysm.  Patient s/p procedure 7/21: CORONARY ARTERY BYPASS GRAFTING x 1 CLIPPING OF ATRIAL APPENDAGE BENTALL PROCEDURE  THORACIC ASCENDING ANEURYSM REPAIR (AAA)  Patient is currently intubated on ventilator support >> OGT in place Temp (24hrs), Avg:97.6 F (36.4 C), Min:96.8 F (36 C), Max:98.4 F (36.9 C)   No nutrition problems identified PTA. No muscle or subcutaneous fat depletion noticed.  Diet Order:  Diet NPO time specified  Skin:  Wound (see comment) (NPWT to chest)  Last BM:  7/17  Height:   Ht Readings from Last 1 Encounters:  11/24/2015 6\' 1"  (1.854 m)    Weight:   Wt Readings from Last 1 Encounters:  12/14/15 306 lb 10.6 oz (139.1 kg)    Ideal Body Weight:  83.6 kg  BMI:  Body mass index is 40.47 kg/(m^2).  Estimated Nutritional Needs:   Kcal:  [id number]  Protein:  >/= 210 gm  Fluid:  per MD  EDUCATION NEEDS:   No education needs identified at this time  Maureen ChattersKatie Cythnia Osmun, RD, LDN Pager #: [phone] After-Hours Pager #: [phone]

## 2015-12-14 NOTE — Progress Notes (Signed)
Patient ID: Sean BoatmanDanny L Nucci, male   DOB: 01/28/1951, 65 y.o.   MRN: [id number]   SICU Evening Rounds:   Hemodynamically stable  CI = 2.0 on same drips  Remains sedated on vent. Oxygenation improved today and PEEP dropped to 8, FiO2 to 90%.  Urine output good  CT output low  CBC    Component Value Date/Time   WBC 13.5* 12/14/2015 1600   RBC 3.41* 12/14/2015 1600   HGB 10.9* 12/14/2015 1603   HCT 32.0* 12/14/2015 1603   PLT 81* 12/14/2015 1600   MCV 90.3 12/14/2015 1600   MCH 31.7 12/14/2015 1600   MCHC 35.1 12/14/2015 1600   RDW 16.6* 12/14/2015 1600   LYMPHSABS 1.4 11/15/2010 1235   MONOABS 0.6 11/15/2010 1235   EOSABS 0.1 11/15/2010 1235   BASOSABS 0.1 11/15/2010 1235     BMET    Component Value Date/Time   NA 138 12/14/2015 1603   K 3.9 12/14/2015 1603   CL 107 12/14/2015 1603   CO2 22 12/14/2015 1600   GLUCOSE 115* 12/14/2015 1603   BUN 18 12/14/2015 1603   CREATININE 1.30* 12/14/2015 1603   CREATININE 1.48* 10/27/2012 1508   CALCIUM 7.8* 12/14/2015 1600   GFRNONAA 53* 12/14/2015 1600   GFRAA >60 12/14/2015 1600     A/P:  He has had a stable day. Will wean off neo as tolerated. Wait on diuresis until tomorrow since filling pressures are lower and urine output has been good.

## 2015-12-14 NOTE — Care Management Important Message (Signed)
Important Message  Patient Details  Name: Sean Hardy MRN: [id number] Date of Birth: 04/08/1951   Medicare Important Message Given:  Yes    [name], [place] Coleman 12/14/2015, 10:17 AM

## 2015-12-14 NOTE — Progress Notes (Signed)
1 Day Post-Op Procedure(s) (LRB): CORONARY ARTERY BYPASS GRAFTING x1  -SVG to RCA (N/A) CLIPPING OF ATRIAL APPENDAGE - Atricure 40 Clip (N/A) TRANSESOPHAGEAL ECHOCARDIOGRAM (TEE) (N/A) BENTALL PROCEDURE (N/A) THORACIC ASCENDING ANEURYSM REPAIR (AAA) (N/A) Subjective: Intubated and sedated  Objective: Vital signs in last 24 hours: Temp:  [96.8 F (36 C)-97.5 F (36.4 C)] 97.5 F (36.4 C) (07/21 0700) Pulse Rate:  [86-100] 97 (07/21 0700) Cardiac Rhythm:  [-] A-V Sequential paced (07/21 0400) Resp:  [14-23] 22 (07/21 0700) BP: (78-116)/(52-72) 111/68 mmHg (07/21 0700) SpO2:  [91 %-100 %] 100 % (07/21 0700) Arterial Line BP: (77-98)/(49-62) 95/62 mmHg (07/21 0700) FiO2 (%):  [100 %] 100 % (07/21 0400) Weight:  [139.1 kg (306 lb 10.6 oz)] 139.1 kg (306 lb 10.6 oz) (07/21 0500)  Hemodynamic parameters for last 24 hours: PAP: (30-33)/(20-24) 32/21 mmHg CVP:  [15 mmHg-19 mmHg] 16 mmHg CO:  [4 L/min-4.5 L/min] 4.5 L/min CI:  [1.7 L/min/m2-1.9 L/min/m2] 1.9 L/min/m2  Intake/Output from previous day: 07/20 0701 - 07/21 0700 In: 13244 [I.V.:7716; WNUUV:2536; NG/GT:60; IV Piggyback:900] Out: 11210 [Urine:6100; Emesis/NG output:150; Blood:4400; Chest Tube:560] Intake/Output this shift:    General appearance: anasarca, intubated and sedated Neurologic: sedated on Precedex. Unable to assess. Moving slightly Heart: regular rate and rhythm, S1, S2 normal, no murmur, click, rub or gallop Lungs: clear to auscultation bilaterally Abdomen: distended, no BS Extremities: warm. anasarca Wound: chest dressing intact. chest tube output low.  Lab Results:  Recent Labs  12/12/2015 2353 12/14/15 0505  WBC 18.5* 21.9*  HGB 12.8* 11.5*  HCT 38.2* 34.6*  PLT 120* 118*   BMET:  Recent Labs  12/05/2015 0424  12/08/2015 2147 12/14/15 0504  NA 138  < > 141 140  K 3.4*  < > 4.9 3.7  CL 108  < > 106 110  CO2 24  --   --  20*  GLUCOSE 108*  < > 148* 207*  BUN 15  < > 14 14  CREATININE 1.24  <  > 0.80 1.43*  CALCIUM 9.0  --   --  8.0*  < > = values in this interval not displayed.  PT/INR:  Recent Labs  11/26/2015 2353  LABPROT 22.1*  INR 1.95*   ABG    Component Value Date/Time   PHART 7.298* 12/14/2015 0500   HCO3 17.6* 12/14/2015 0500   TCO2 19 12/14/2015 0500   ACIDBASEDEF 8.0* 12/14/2015 0500   O2SAT 96.0 12/14/2015 0500   CBG (last 3)   Recent Labs  12/14/15 0459 12/14/15 0556 12/14/15 0701  GLUCAP 211* 179* 187*   CLINICAL DATA: Status post CABG yesterday  EXAM: PORTABLE CHEST 1 VIEW  COMPARISON: Portable chest x-ray of December 14, 2015  FINDINGS: The lungs remain mildly hypoinflated. Pleural effusions layer posteriorly, bilaterally. There is no pneumothorax. The heart is top-normal in size. The pulmonary vascularity is engorged and more conspicuous today. The endotracheal tube tip projects 3.7 cm above the carina. The esophagogastric tube tip projects below the inferior margin of the image. A mediastinal drain is in stable position. There is a left atrial appendage clip and a prosthetic aortic valve ring. The Swan-Ganz catheter tip projects in the proximal right main pulmonary artery.  IMPRESSION: Slight increased prominence of the pulmonary interstitium consistent with interstitial edema. Small bilateral pleural effusions layering posteriorly. The support tubes are in stable position.   Electronically Signed  By: David Swaziland M.D.  On: 12/14/2015 07:34       Assessment/Plan: S/P Procedure(s) (LRB): CORONARY ARTERY BYPASS  GRAFTING x1  -SVG to RCA (N/A) CLIPPING OF ATRIAL APPENDAGE - Atricure 40 Clip (N/A) TRANSESOPHAGEAL ECHOCARDIOGRAM (TEE) (N/A) BENTALL PROCEDURE (N/A) THORACIC ASCENDING ANEURYSM REPAIR (AAA) (N/A)  CV: He is fairly stable on Dop 7, Milrinone 0.3, levophed 12, neo 50 mcg and NO 30 ppm. CVP 16 this am. RV dysfunction and poor oxygenation in the OR so chest remains open. Will try to maintain stability today  and let him recover.  Resp: Oxygen sats in the low to mid 80's off bypass improving with higher peep and vent changes. His sat is now 100 on 100% FiO2 and 10 of PEEP. CXR just shows some edema and LL atelectasis. Continue ventilator support and sedation. His preop room air sat was only 92% with a moderate obstructive defect and severe diffusion defect.  Renal: good urine output and creat stable so far. He has some metabolic acidosis that I am trying to correct.   Overall plan is to maintain stability for a few days and get some volume off and improve oxygenation before considering closing chest.     LOS: 4 days    Alleen BorneBryan K Patricio Popwell 12/14/2015

## 2015-12-14 NOTE — Anesthesia Postprocedure Evaluation (Signed)
Anesthesia Post Note  Patient: Justyce L Hagey  Procedure(s) Performed: Procedure(s) (LRB): CORONARY ARTERY BYPASS GRAFTING x1  -SVG to RCA (N/A) CLIPPING OF ATRIAL APPENDAGE - Atricure 40 Clip (N/A) TRANSESOPHAGEAL ECHOCARDIOGRAM (TEE) (N/A) BENTALL PROCEDURE (N/A) THORACIC ASCENDING ANEURYSM REPAIR (AAA) (N/A)  Patient location during evaluation: SICU Anesthesia Type: General Level of consciousness: patient remains intubated per anesthesia plan Pain management: pain level controlled Vital Signs Assessment: post-procedure vital signs reviewed and stable Respiratory status: patient remains intubated per anesthesia plan Cardiovascular status: stable Postop Assessment: no signs of nausea or vomiting Anesthetic complications: no Comments: Critically ill male taken to SICU after approximately 9 hour bypass run    Last Vitals:  Filed Vitals:   12/14/15 0500 12/14/15 0600  BP: 105/72 116/72  Pulse: 97 100  Temp: 36.2 C 36.3 C  Resp: 20 23    Last Pain:  Filed Vitals:   12/14/15 0607  PainSc: 0-No pain                 Tationa Stech S

## 2015-12-15 ENCOUNTER — Inpatient Hospital Stay (HOSPITAL_COMMUNITY): Payer: Medicare Other

## 2015-12-15 DIAGNOSIS — J95821 Acute postprocedural respiratory failure: Secondary | ICD-10-CM

## 2015-12-15 DIAGNOSIS — I255 Ischemic cardiomyopathy: Secondary | ICD-10-CM

## 2015-12-15 DIAGNOSIS — I712 Thoracic aortic aneurysm, without rupture: Principal | ICD-10-CM

## 2015-12-15 LAB — POCT I-STAT 3, ART BLOOD GAS (G3+)
Acid-base deficit: 2 mmol/L (ref 0.0–2.0)
Acid-base deficit: 5 mmol/L — ABNORMAL HIGH (ref 0.0–2.0)
Acid-base deficit: 6 mmol/L — ABNORMAL HIGH (ref 0.0–2.0)
Bicarbonate: 21.7 mEq/L (ref 20.0–24.0)
Bicarbonate: 19.3 mEq/L — ABNORMAL LOW (ref 20.0–24.0)
Bicarbonate: 19.3 mEq/L — ABNORMAL LOW (ref 20.0–24.0)
O2 SAT: 95 %
O2 SAT: 88 %
O2 SAT: 88 %
PCO2 ART: 32.3 mmHg — AB (ref 35.0–45.0)
PCO2 ART: 34.1 mmHg — AB (ref 35.0–45.0)
PCO2 ART: 40.6 mmHg (ref 35.0–45.0)
PH ART: 7.436 (ref 7.350–7.450)
PH ART: 7.366 (ref 7.350–7.450)
PO2 ART: 67 mmHg — AB (ref 80.0–100.0)
Patient temperature: 38.2
Patient temperature: 38.8
TCO2: 23 mmol/L (ref 0–100)
TCO2: 20 mmol/L (ref 0–100)
TCO2: 20 mmol/L (ref 0–100)
pH, Arterial: 7.294 — ABNORMAL LOW (ref 7.350–7.450)
pO2, Arterial: 70 mmHg — ABNORMAL LOW (ref 80.0–100.0)
pO2, Arterial: 59 mmHg — ABNORMAL LOW (ref 80.0–100.0)

## 2015-12-15 LAB — GLUCOSE, CAPILLARY
GLUCOSE-CAPILLARY: 102 mg/dL — AB (ref 65–99)
GLUCOSE-CAPILLARY: 108 mg/dL — AB (ref 65–99)
GLUCOSE-CAPILLARY: 109 mg/dL — AB (ref 65–99)
GLUCOSE-CAPILLARY: 110 mg/dL — AB (ref 65–99)
GLUCOSE-CAPILLARY: 113 mg/dL — AB (ref 65–99)
GLUCOSE-CAPILLARY: 115 mg/dL — AB (ref 65–99)
GLUCOSE-CAPILLARY: 120 mg/dL — AB (ref 65–99)
GLUCOSE-CAPILLARY: 126 mg/dL — AB (ref 65–99)
GLUCOSE-CAPILLARY: 133 mg/dL — AB (ref 65–99)
GLUCOSE-CAPILLARY: 135 mg/dL — AB (ref 65–99)
GLUCOSE-CAPILLARY: 140 mg/dL — AB (ref 65–99)
GLUCOSE-CAPILLARY: 142 mg/dL — AB (ref 65–99)
GLUCOSE-CAPILLARY: 152 mg/dL — AB (ref 65–99)
GLUCOSE-CAPILLARY: 85 mg/dL (ref 65–99)
GLUCOSE-CAPILLARY: 86 mg/dL (ref 65–99)
GLUCOSE-CAPILLARY: 93 mg/dL (ref 65–99)
GLUCOSE-CAPILLARY: 99 mg/dL (ref 65–99)
GLUCOSE-CAPILLARY: 99 mg/dL (ref 65–99)
Glucose-Capillary: 105 mg/dL — ABNORMAL HIGH (ref 65–99)
Glucose-Capillary: 111 mg/dL — ABNORMAL HIGH (ref 65–99)
Glucose-Capillary: 106 mg/dL — ABNORMAL HIGH (ref 65–99)
Glucose-Capillary: 112 mg/dL — ABNORMAL HIGH (ref 65–99)
Glucose-Capillary: 112 mg/dL — ABNORMAL HIGH (ref 65–99)
Glucose-Capillary: 64 mg/dL — ABNORMAL LOW (ref 65–99)
Glucose-Capillary: 75 mg/dL (ref 65–99)
Glucose-Capillary: 141 mg/dL — ABNORMAL HIGH (ref 65–99)
Glucose-Capillary: 158 mg/dL — ABNORMAL HIGH (ref 65–99)
Glucose-Capillary: 153 mg/dL — ABNORMAL HIGH (ref 65–99)
Glucose-Capillary: 138 mg/dL — ABNORMAL HIGH (ref 65–99)
Glucose-Capillary: 142 mg/dL — ABNORMAL HIGH (ref 65–99)
Glucose-Capillary: 130 mg/dL — ABNORMAL HIGH (ref 65–99)

## 2015-12-15 LAB — CBC
HCT: 27.3 % — ABNORMAL LOW (ref 39.0–52.0)
HEMATOCRIT: 28.6 % — AB (ref 39.0–52.0)
HEMOGLOBIN: 9.4 g/dL — AB (ref 13.0–17.0)
Hemoglobin: 9.8 g/dL — ABNORMAL LOW (ref 13.0–17.0)
MCH: 30.7 pg (ref 26.0–34.0)
MCH: 32.5 pg (ref 26.0–34.0)
MCHC: 35.9 g/dL (ref 30.0–36.0)
MCHC: 32.9 g/dL (ref 30.0–36.0)
MCV: 90.4 fL (ref 78.0–100.0)
MCV: 93.5 fL (ref 78.0–100.0)
Platelets: 51 10*3/uL — ABNORMAL LOW (ref 150–400)
Platelets: 63 10*3/uL — ABNORMAL LOW (ref 150–400)
RBC: 3.02 MIL/uL — ABNORMAL LOW (ref 4.22–5.81)
RBC: 3.06 MIL/uL — AB (ref 4.22–5.81)
RDW: 16.7 % — AB (ref 11.5–15.5)
RDW: 16.8 % — AB (ref 11.5–15.5)
WBC: 10.1 10*3/uL (ref 4.0–10.5)
WBC: 11.2 10*3/uL — AB (ref 4.0–10.5)

## 2015-12-15 LAB — BASIC METABOLIC PANEL
ANION GAP: 7 (ref 5–15)
Anion gap: 6 (ref 5–15)
BUN: 20 mg/dL (ref 6–20)
BUN: 25 mg/dL — ABNORMAL HIGH (ref 6–20)
CALCIUM: 7.8 mg/dL — AB (ref 8.9–10.3)
CHLORIDE: 107 mmol/L (ref 101–111)
CO2: 22 mmol/L (ref 22–32)
CO2: 21 mmol/L — ABNORMAL LOW (ref 22–32)
CREATININE: 1.4 mg/dL — AB (ref 0.61–1.24)
Calcium: 7.4 mg/dL — ABNORMAL LOW (ref 8.9–10.3)
Chloride: 109 mmol/L (ref 101–111)
Creatinine, Ser: 1.83 mg/dL — ABNORMAL HIGH (ref 0.61–1.24)
GFR calc non Af Amer: 52 mL/min — ABNORMAL LOW (ref >60)
GFR calc non Af Amer: 37 mL/min — ABNORMAL LOW (ref >60)
GFR, EST AFRICAN AMERICAN: 60 mL/min — AB (ref >60)
GFR, EST AFRICAN AMERICAN: 43 mL/min — AB (ref >60)
Glucose, Bld: 103 mg/dL — ABNORMAL HIGH (ref 65–99)
Glucose, Bld: 148 mg/dL — ABNORMAL HIGH (ref 65–99)
POTASSIUM: 4.5 mmol/L (ref 3.5–5.1)
Potassium: 4.1 mmol/L (ref 3.5–5.1)
SODIUM: 137 mmol/L (ref 135–145)
SODIUM: 135 mmol/L (ref 135–145)

## 2015-12-15 LAB — PREPARE RBC (CROSSMATCH)

## 2015-12-15 MED ORDER — AMIODARONE HCL IN DEXTROSE 360-4.14 MG/200ML-% IV SOLN
30.0000 mg/h | INTRAVENOUS | Status: DC
  Administered 2015-12-15 (×2): 30 mg/h via INTRAVENOUS
  Filled 2015-12-15: qty 200

## 2015-12-15 MED ORDER — FAMOTIDINE IN NACL 20-0.9 MG/50ML-% IV SOLN
20.0000 mg | Freq: Two times a day (BID) | INTRAVENOUS | Status: DC
  Administered 2015-12-15 (×2): 20 mg via INTRAVENOUS
  Filled 2015-12-15 (×2): qty 50

## 2015-12-15 MED ORDER — SODIUM CHLORIDE 0.9 % IV SOLN
Freq: Once | INTRAVENOUS | Status: AC
  Administered 2015-12-15: 16:00:00 via INTRAVENOUS

## 2015-12-15 MED ORDER — SODIUM BICARBONATE 8.4 % IV SOLN
50.0000 meq | Freq: Once | INTRAVENOUS | Status: AC
  Administered 2015-12-15: 50 meq via INTRAVENOUS

## 2015-12-15 MED ORDER — FENTANYL CITRATE (PF) 100 MCG/2ML IJ SOLN
50.0000 ug | Freq: Once | INTRAMUSCULAR | Status: AC
  Administered 2015-12-15: 50 ug via INTRAVENOUS

## 2015-12-15 MED ORDER — VECURONIUM BROMIDE 10 MG IV SOLR
INTRAVENOUS | Status: AC
  Administered 2015-12-15: 10 mg via INTRAVENOUS
  Filled 2015-12-15: qty 10

## 2015-12-15 MED ORDER — VECURONIUM BROMIDE 10 MG IV SOLR
10.0000 mg | Freq: Once | INTRAVENOUS | Status: AC
Start: 2015-12-15 — End: 2015-12-15
  Administered 2015-12-15 (×2): 10 mg via INTRAVENOUS

## 2015-12-15 MED ORDER — SODIUM BICARBONATE 8.4 % IV SOLN
INTRAVENOUS | Status: AC
  Filled 2015-12-15: qty 50

## 2015-12-15 MED ORDER — FENTANYL CITRATE (PF) 2500 MCG/50ML IJ SOLN
50.0000 ug/h | INTRAMUSCULAR | Status: DC
  Administered 2015-12-15: 50 ug/h via INTRAVENOUS
  Filled 2015-12-15 (×2): qty 50

## 2015-12-15 MED ORDER — MIDAZOLAM HCL 5 MG/ML IJ SOLN
0.0000 mg/h | INTRAMUSCULAR | Status: DC
  Administered 2015-12-15 (×2): 2 mg/h via INTRAVENOUS
  Filled 2015-12-15 (×2): qty 10

## 2015-12-15 MED ORDER — AMIODARONE HCL IN DEXTROSE 360-4.14 MG/200ML-% IV SOLN
INTRAVENOUS | Status: AC
  Administered 2015-12-15: 60 mg/h via INTRAVENOUS
  Filled 2015-12-15: qty 200

## 2015-12-15 MED ORDER — VANCOMYCIN HCL 10 G IV SOLR
1250.0000 mg | INTRAVENOUS | Status: DC
  Administered 2015-12-15: 1250 mg via INTRAVENOUS
  Filled 2015-12-15 (×2): qty 1250

## 2015-12-15 MED ORDER — MIDAZOLAM HCL 2 MG/2ML IJ SOLN: 2.0000 mg | INTRAMUSCULAR | Status: DC | PRN

## 2015-12-15 MED ORDER — FENTANYL BOLUS VIA INFUSION
50.0000 ug | INTRAVENOUS | Status: DC | PRN
  Filled 2015-12-15: qty 50

## 2015-12-15 MED ORDER — FUROSEMIDE 10 MG/ML IJ SOLN
10.0000 mg/h | INTRAVENOUS | Status: DC
  Administered 2015-12-15: 10 mg/h via INTRAVENOUS
  Filled 2015-12-15 (×2): qty 25

## 2015-12-15 MED ORDER — VASOPRESSIN 20 UNIT/ML IV SOLN: 0.0300 m[IU]/kg/min | INTRAVENOUS | Status: DC

## 2015-12-15 MED ORDER — ACETAMINOPHEN 160 MG/5ML PO SOLN
650.0000 mg | ORAL | Status: DC | PRN
  Administered 2015-12-15 (×2): 650 mg
  Filled 2015-12-15: qty 20.3

## 2015-12-15 MED ORDER — SODIUM CHLORIDE 0.9 % IV SOLN
25.0000 ug/h | INTRAVENOUS | Status: DC
  Administered 2015-12-15: 175 ug/h via INTRAVENOUS
  Filled 2015-12-15 (×2): qty 50

## 2015-12-15 MED ORDER — VASOPRESSIN 20 UNIT/ML IV SOLN
0.0300 [IU]/min | INTRAVENOUS | Status: DC
  Administered 2015-12-15: 0.03 [IU]/min via INTRAVENOUS
  Filled 2015-12-15 (×2): qty 2

## 2015-12-15 MED ORDER — AMIODARONE IV BOLUS ONLY 150 MG/100ML: 150.0000 mg | Freq: Once | INTRAVENOUS | Status: DC

## 2015-12-15 MED ORDER — AMIODARONE LOAD VIA INFUSION
150.0000 mg | Freq: Once | INTRAVENOUS | Status: AC
  Administered 2015-12-15: 150 mg via INTRAVENOUS
  Filled 2015-12-15: qty 83.34

## 2015-12-15 MED ORDER — AMIODARONE HCL IN DEXTROSE 360-4.14 MG/200ML-% IV SOLN
60.0000 mg/h | INTRAVENOUS | Status: AC
  Administered 2015-12-15 (×2): 60 mg/h via INTRAVENOUS

## 2015-12-15 MED ORDER — LEVOFLOXACIN IN D5W 750 MG/150ML IV SOLN
750.0000 mg | INTRAVENOUS | Status: DC
  Administered 2015-12-15: 750 mg via INTRAVENOUS
  Filled 2015-12-15 (×2): qty 150

## 2015-12-15 MED ORDER — ENOXAPARIN SODIUM 40 MG/0.4ML ~~LOC~~ SOLN
40.0000 mg | SUBCUTANEOUS | Status: DC
  Administered 2015-12-15: 40 mg via SUBCUTANEOUS
  Filled 2015-12-15: qty 0.4

## 2015-12-15 NOTE — Progress Notes (Signed)
MD notified of pt appearing air hungry. Precedex drip unchanged at 1.15mcg, 2mg  versed given will no notable changes. BP per aline continues to remain soft ranging 70s-90s/40s with increasing Levo drip. Levo has been titrated from to 16 with min. Improvement.

## 2015-12-15 NOTE — Progress Notes (Signed)
Pharmacy Antibiotic Note  Sean Hardy is a 65 y.o. male admitted on 11/30/2015 for open heart surgery- now with open sternal wounds.  Pharmacy has been consulted for levofloxacin and vancomycin dosing.  Note: patient received pre- and post-op doses of these antibiotics. Last levofloxacin dose 7/21 at 1111, last vancomycin dose 7/21 at 0100.  With AKI- normalized CrCl ~7mL/min. Also note he is up ~50lbs from pre-op weight.  Plan: -Levofloxacin 750mg  IV q24h -Vancomycin 1250mg  IV q24h per obesity nomogram. Will not load d/t AKI and patient receiving doses perioperatively. -Goal trough 10-61mcg/mL unless there is concern for abscess, then goal will be higher -Follow for renal function, any cultures, clinical progression, LOT  Height: 6\' 1"  (185.4 cm) Weight: (!) 301 lb 2.4 oz (136.6 kg) IBW/kg (Calculated) : 79.9  Temp (24hrs), Avg:98.9 F (37.2 C), Min:97.7 F (36.5 C), Max:100.9 F (38.3 C)   Recent Labs Lab 04-Jan-2016 0424  January 04, 2016 2147 01/04/2016 2353 12/14/15 0504 12/14/15 0505 12/14/15 1600 12/14/15 1603 12/15/15 0011 12/15/15 0400  WBC 5.8  --   --  18.5*  --  21.9* 13.5*  --  10.1  --   CREATININE 1.24  < > 0.80  --  1.43*  --  1.37* 1.30*  --  1.40*  < > = values in this interval not displayed.  Estimated Creatinine Clearance: 77.4 mL/min (by C-G formula based on Cr of 1.4).    Allergies  Allergen Reactions  . Penicillins Other (See Comments)    Unknown. Has patient had a PCN reaction causing immediate rash, facial/tongue/throat swelling, SOB or lightheadedness with hypotension: No Has patient had a PCN reaction causing severe rash involving mucus membranes or skin necrosis: No Has patient had a PCN reaction that required hospitalization No Has patient had a PCN reaction occurring within the last 10 years: No If all of the above answers are "NO", then may proceed with Cephalosporin use.     Antimicrobials this admission: LVQ 7/20>>7/21; 7/22>> Vanc  7/20>>7/21; 7/22>>  Dose adjustments this admission: n/a  Microbiology results: N/a  Thank you for allowing pharmacy to be a part of this patient's care.  Evolett Somarriba D. Sergei Delo, PharmD, BCPS Clinical Pharmacist Pager: [phone] 12/15/2015 11:18 AM

## 2015-12-15 NOTE — Consult Note (Signed)
PULMONARY / CRITICAL CARE MEDICINE   Name: Sean Hardy MRN: [id number] DOB: 1950/09/21    ADMISSION DATE:  12-25-15 CONSULTATION DATE:  7/22  REFERRING MD:  Laneta Simmers   CHIEF COMPLAINT:  Vent management   HISTORY OF PRESENT ILLNESS:   This is a 65 year old male who was admitted on 7/17 for aortic root aneurysm repair and CABG. He has a sig h/o atrial fib on NOAC so surgery was held off until 7/20 as pt had only stopped 2d prior to arrival. He underwent Bentall biological graft procedure, replacement/grafting of ascending aorta, and CABG of SVG to RCA on 7/20. His OR course was c/b poor oxygenation and RV dysfxn so he returned to the SICU w/ chest open. Has required high FIo2 and peep which has improved and as of 7/22 remains relatively hemodynamically stable on levophed, milrinione, and nitric. PCCM was consulted to assist w/ ventilator management.   PAST MEDICAL HISTORY :  He  has a past medical history of Arteriosclerotic cardiovascular disease (ASCVD) (2006); Hypertension; CVA (cerebral infarction) (2006); Hypothyroidism; Atrial flutter (HCC) (2012); Cardiomyopathy (2012); Chronic anticoagulation (2012); Tobacco abuse; Gout; Benign prostatic hypertrophy; Stroke (HCC); Anxiety; Major depressive disorder (HCC); CHF (congestive heart failure) (HCC); Hyperlipidemia; Peripheral vascular disease (HCC); Atrial fibrillation (HCC); GERD (gastroesophageal reflux disease); Muscle weakness; Aortic aneurysm, thoracic (HCC); Coronary artery disease; High cholesterol; Nephrolithiasis; Shortness of breath dyspnea; Arthritis; and 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  PAST SURGICAL HISTORY: He  has past surgical history that includes Lithotripsy; Cardioversion; Appendectomy; Cataract extraction w/ intraocular lens  implant, bilateral (Bilateral); Coronary angioplasty with stent (2003; 2006); Cardiac catheterization (11/12/2015); Cardiac catheterization (N/A, 11/12/2015); Aortogram (11/12/2015); Coronary artery  bypass graft (N/A, 12/19/2015); Clipping of atrial appendage (N/A, 12/22/2015); TEE without cardioversion (N/A, 11/30/2015); Bentall procedure (N/A, 12/19/2015); and Thoracic aortic aneurysm repair (N/A, 12/05/2015).  Allergies  Allergen Reactions  . Penicillins Other (See Comments)    Unknown. Has patient had a PCN reaction causing immediate rash, facial/tongue/throat swelling, SOB or lightheadedness with hypotension: No Has patient had a PCN reaction causing severe rash involving mucus membranes or skin necrosis: No Has patient had a PCN reaction that required hospitalization No Has patient had a PCN reaction occurring within the last 10 years: No If all of the above answers are "NO", then may proceed with Cephalosporin use.     No current facility-administered medications on file prior to encounter.   Current Outpatient Prescriptions on File Prior to Encounter  Medication Sig  . allopurinol (ZYLOPRIM) 300 MG tablet Take 300 mg by mouth every morning.   Marland Kitchen amiodarone (PACERONE) 200 MG tablet TAKE ONE TABLET BY MOUTH EVERY DAY (Patient taking differently: TAKE ONE TABLET (200 mg) BY MOUTH EVERY morning)  . amLODipine (NORVASC) 5 MG tablet Take 10 mg by mouth every morning.   Marland Kitchen aspirin EC 81 MG EC tablet Take 1 tablet (81 mg total) by mouth daily. (Patient taking differently: Take 81 mg by mouth every morning. )  . atorvastatin (LIPITOR) 40 MG tablet Take 40 mg by mouth daily at 6 PM.   . Cholecalciferol (VITAMIN D3 ULTRA POTENCY) 69629 units TABS Take 1 tablet by mouth every 30 (thirty) days. On the 15th of every month  . furosemide (LASIX) 40 MG tablet Take 40 mg by mouth every morning.   . isosorbide mononitrate (IMDUR) 30 MG 24 hr tablet Take 1 tablet (30 mg total) by mouth daily. (Patient taking differently: Take 30 mg by mouth every morning. )  . levothyroxine (SYNTHROID,  LEVOTHROID) 175 MCG tablet Take 175 mcg by mouth daily before breakfast.  . lisinopril (PRINIVIL,ZESTRIL) 5 MG tablet  Take 5 mg by mouth every morning.   . metoprolol (LOPRESSOR) 50 MG tablet Take 50 mg by mouth 2 (two) times daily.  . nitroGLYCERIN (NITROSTAT) 0.4 MG SL tablet Place 0.4 mg under the tongue every 5 (five) minutes as needed for chest pain. Reported on 11/29/2015  . Olopatadine HCl 0.2 % SOLN Place 1 drop into both eyes every morning.   . Omega-3 Fatty Acids (SUPER OMEGA-EPA) 1000 MG CAPS Take 2,000 mg by mouth 2 (two) times daily. Reported on 11/29/2015  . omeprazole (PRILOSEC) 20 MG capsule Take 20 mg by mouth every morning. On an empty stomach  . polyethylene glycol (MIRALAX / GLYCOLAX) packet Take 17 g by mouth daily as needed for mild constipation. Reported on 11/29/2015  . rivaroxaban (XARELTO) 20 MG TABS tablet Take 1 tablet (20 mg total) by mouth daily with supper.  . senna (SENOKOT) 8.6 MG tablet Take 1 tablet by mouth 2 (two) times daily.  . solifenacin (VESICARE) 5 MG tablet Take 5 mg by mouth at bedtime.   . tamsulosin (FLOMAX) 0.4 MG CAPS Take 0.4 mg by mouth at bedtime.   . traMADol (ULTRAM) 50 MG tablet Take 50 mg by mouth every 8 (eight) hours.    FAMILY HISTORY:  His has no family status information on file.   SOCIAL HISTORY: He  reports that he quit smoking about 3 years ago. His smoking use included Cigarettes. He started smoking about 58 years ago. He has a 20 pack-year smoking history. He has never used smokeless tobacco. He reports that he drinks about 1.2 oz of alcohol per week. He reports that he does not use illicit drugs.  REVIEW OF SYSTEMS:   Unable  SUBJECTIVE:  Sedated   VITAL SIGNS: BP 126/60 mmHg  Pulse 91  Temp(Src) 101.5 F (38.6 C) (Core (Comment))  Resp 30  Ht 6\' 1"  (1.854 m)  Wt 301 lb 2.4 oz (136.6 kg)  BMI 39.74 kg/m2  SpO2 97%  HEMODYNAMICS: PAP: (25-41)/(8-23) 35/18 mmHg CVP:  [10 mmHg-18 mmHg] 17 mmHg CO:  [4.5 L/min-6 L/min] 6 L/min CI:  [1.9 L/min/m2-2.5 L/min/m2] 2.5 L/min/m2  VENTILATOR SETTINGS: Vent Mode:  [-] PRVC FiO2 (%):  [50  %-100 %] 80 % Set Rate:  [20 bmp] 20 bmp Vt Set:  [640 mL] 640 mL PEEP:  [5 cmH20-10 cmH20] 10 cmH20 Pressure Support:  [10 cmH20] 10 cmH20 Plateau Pressure:  [16 cmH20-21 cmH20] 20 cmH20  INTAKE / OUTPUT: I/O last 3 completed shifts: In: 11660.8 [I.V.:7436.8; EUMPN:3614; NG/GT:210; IV Piggyback:1450] Out: 43154 [Urine:7545; Emesis/NG output:800; Blood:4400; Chest Tube:1330]  PHYSICAL EXAMINATION: General:  Critically ill appearing white male sedated on vent  Neuro:  Sedated, just got NMB d/t vent asynchrony  HEENT:  Orally intubated. MMM, no JVD IJ CVL good position  Cardiovascular:  Regular irreg chest wound vac intact. AF on tele  Lungs:  Diffuse rhonchi, crackles bases. Left CT bloody output no airleak  Abdomen:  Soft, + bowel sounds  Musculoskeletal:  Equal st and bulk  Skin:  Warm and dry   LABS:  BMET  Recent Labs Lab 12/14/15 0504 12/14/15 1600 12/14/15 1603 12/15/15 0400  NA 140 139 138 137  K 3.7 3.9 3.9 4.1  CL 110 109 107 109  CO2 20* 22  --  22  BUN 14 17 18 20   CREATININE 1.43* 1.37* 1.30* 1.40*  GLUCOSE 207* 114*  115* 103*    Electrolytes  Recent Labs Lab 12/14/15 0504 12/14/15 1600 12/15/15 0400  CALCIUM 8.0* 7.8* 7.8*  MG 2.9* 2.4  --     CBC  Recent Labs Lab 12/14/15 0505 12/14/15 1600 12/14/15 1603 12/15/15 0011  WBC 21.9* 13.5*  --  10.1  HGB 11.5* 10.8* 10.9* 9.8*  HCT 34.6* 30.8* 32.0* 27.3*  PLT 118* 81*  --  63*    Coag's  Recent Labs Lab 11/24/2015 0606 11/30/2015 1745 2016-01-07 2353  APTT  --  77* 42*  INR 1.14  --  1.95*    Sepsis Markers No results for input(s): LATICACIDVEN, PROCALCITON, O2SATVEN in the last 168 hours.  ABG  Recent Labs Lab 12/14/15 1559 12/14/15 1909 12/15/15 0406  PHART 7.429 7.421 7.436  PCO2ART 33.4* 32.9* 32.3*  PO2ART 177.0* 95.0 70.0*    Liver Enzymes No results for input(s): AST, ALT, ALKPHOS, BILITOT, ALBUMIN in the last 168 hours.  Cardiac Enzymes No results for input(s):  TROPONINI, PROBNP in the last 168 hours.  Glucose  Recent Labs Lab 12/15/15 0201 12/15/15 0303 12/15/15 0404 12/15/15 0505 12/15/15 0601 12/15/15 0651  GLUCAP 111* 106* 99 112* 110* 112*    Imaging Dg Chest Port 1 View  12/15/2015  CLINICAL DATA:  Status post ascending thoracic aorta repair with aortic valve replacement and coronary artery bypass. EXAM: PORTABLE CHEST 1 VIEW COMPARISON:  12/14/2015 FINDINGS: Endotracheal remains with the tip approximately 3.5 cm above the carina. Swan-Ganz catheter tip lies in the proximal right pulmonary artery. Mediastinal drain and nasogastric tube remain. Stable small bilateral pleural effusions and left lower lobe atelectasis. No pneumothorax identified. The heart size and mediastinal contours are stable. Stable appearance of aortic valve and left atrial appendage clip. IMPRESSION: Stable bilateral pleural effusions and left lower lobe atelectasis. No pneumothorax. Electronically Signed   By: Irish Lack M.D.   On: 12/15/2015 09:05     STUDIES:    CULTURES:   ANTIBIOTICS: levaquin 7/20>>> vanc 7/20>>>  SIGNIFICANT EVENTS:    LINES/TUBES: PAC 7/20>>> OETT 7/20>>> Left chest tube 7/20>>>  DISCUSSION: 64 yom S/p Bentall procedure, ascending thoracic aneurysm repair and SVC to RCA CABG on 7/20. His chest was left open due to marked hypoxia and RV fxn intra-op. Had been improving until this afternoon now w/ new AF and shock. We have been asked to see for ventilator management as CVTS manages shock w/ hope to eventually return to the OR.     ASSESSMENT / PLAN:  PULMONARY A: Acute hypoxic respiratory failure Ventilator dependence  Pulmonary edema ->persistent hypoxia. Chest still open but CXR looks to be improving & hemodynamics better.   P:   Full vent support PAD protocol F/u am CXR Sputum culture   CARDIOVASCULAR A:  S/p Bentall procedure, ascending thoracic aneurysm repair and SVC to RCA CABG.  Open chest   Persistent Cardiogenic shock +/- sepsis (7/22) New onset AF  P:  Hemodynamic monitoring per CVTS Weaning NO today  Follow hemodynamics  See ID section  CVTS to determine timing of OR  RENAL A:   AKI  P:   Trend chemistry  Maximize CO Strict I&O  GASTROINTESTINAL A:   Obesity  P:   SUP w/ pepcid  Consider tubefeeds in next day or so IF not extubated   HEMATOLOGIC A:   Acute anemia  Thrombocytopenia  P:  Trend CNC  Cont LMWH Transfuse as indicated  INFECTIOUS A:   R/o sepsis. No clear source but now in shock  as of 7/22 P:   Has been on abx since 7/20; little benefit of culture at this point.  Cont abx per primary team   ENDOCRINE A:   No acute  P:   Trend glucose   NEUROLOGIC A:   Sedation   P:   RASS goal: -3 PAD protocol    FAMILY  - Updates: none available   Simonne Martinet ACNP-BC [facility] Pulmonary/Critical Care Pager # [phone] OR # [phone] if no answer   12/15/2015, 3:18 PM

## 2015-12-15 NOTE — Progress Notes (Signed)
Patient ID: Sean Hardy, male   DOB: 03/16/1951, 65 y.o.   MRN: [id number]   SICU Evening Rounds:   Hemodynamically labile with BP 70-80 by arterial line, 90-100 by cuff.  He is on 16 mcg Levophed, dop 7, milrinone 0.3, NO 30 ppm CI = 2.5 this am.  PA pressures and CVP have been normal for him on the vent Remains in atrial fib in the 80's on amiodarone. Have not diuresed today due to low BP. Transfused a unit of PRBC's for volume but BP not changed. Fever to 101.8 which is probably contributing to hypotension. He may be septic. Vanc and Levaquin started.  UO about 25/hr this afternoon.   CT output low  He was seen by CCM and changed to [facility] with better vent synchronization but he did require paralytic agent once.  CBC    Component Value Date/Time   WBC 10.1 12/15/2015 0011   RBC 3.02* 12/15/2015 0011   HGB 9.8* 12/15/2015 0011   HCT 27.3* 12/15/2015 0011   PLT 63* 12/15/2015 0011   MCV 90.4 12/15/2015 0011   MCH 32.5 12/15/2015 0011   MCHC 35.9 12/15/2015 0011   RDW 16.7* 12/15/2015 0011   LYMPHSABS 1.4 11/15/2010 1235   MONOABS 0.6 11/15/2010 1235   EOSABS 0.1 11/15/2010 1235   BASOSABS 0.1 11/15/2010 1235     BMET    Component Value Date/Time   NA 137 12/15/2015 0400   K 4.1 12/15/2015 0400   CL 109 12/15/2015 0400   CO2 22 12/15/2015 0400   GLUCOSE 103* 12/15/2015 0400   BUN 20 12/15/2015 0400   CREATININE 1.40* 12/15/2015 0400   CREATININE 1.48* 10/27/2012 1508   CALCIUM 7.8* 12/15/2015 0400   GFRNONAA 52* 12/15/2015 0400   GFRAA 60* 12/15/2015 0400     A/P:  Hypotension today probably a combination of atrial fib, sepsis. Will add vasopressin .03 to see if we can improve BP without increasing Levophed further. I think his intravascular volume status is ok at this time.

## 2015-12-15 NOTE — Progress Notes (Signed)
2 Days Post-Op Procedure(s) (LRB): CORONARY ARTERY BYPASS GRAFTING x1  -SVG to RCA (N/A) CLIPPING OF ATRIAL APPENDAGE - Atricure 40 Clip (N/A) TRANSESOPHAGEAL ECHOCARDIOGRAM (TEE) (N/A) BENTALL PROCEDURE (N/A) THORACIC ASCENDING ANEURYSM REPAIR (AAA) (N/A) Subjective: Intubated and sedated but nurse reports that he has been moving around and following commands  Objective: Vital signs in last 24 hours: Temp:  [97.7 F (36.5 C)-99.5 F (37.5 C)] 99 F (37.2 C) (07/22 0745) Pulse Rate:  [83-98] 95 (07/22 0748) Cardiac Rhythm:  [-] Normal sinus rhythm (07/22 0751) Resp:  [0-28] 20 (07/22 0748) BP: (96-129)/(52-71) 114/67 mmHg (07/22 0748) SpO2:  [92 %-100 %] 97 % (07/22 0748) Arterial Line BP: (74-139)/(44-71) 95/51 mmHg (07/22 0745) FiO2 (%):  [50 %-100 %] 50 % (07/22 0748) Weight:  [136.6 kg (301 lb 2.4 oz)] 136.6 kg (301 lb 2.4 oz) (07/22 0500)  Hemodynamic parameters for last 24 hours: PAP: (25-41)/(8-28) 29/15 mmHg CVP:  [10 mmHg-20 mmHg] 11 mmHg CO:  [4.5 L/min-6 L/min] 6 L/min CI:  [1.9 L/min/m2-2.5 L/min/m2] 2.5 L/min/m2  Intake/Output from previous day: 07/21 0701 - 07/22 0700 In: 4418.9 [I.V.:3718.9; NG/GT:150; IV Piggyback:550] Out: 3090 [Urine:1670; Emesis/NG output:650; Chest Tube:770] Intake/Output this shift: Total I/O In: 30 [NG/GT:30] Out: -   General appearance: intubated, calm Heart: regular rate and rhythm, S1, S2 normal, no murmur, click, rub or gallop Lungs: clear to auscultation bilaterally Abdomen: hypoactive bowel sounds, protuberant but soft Extremities: anasarca Wound: chest VAC and Esmark in place. chest tube output low, serosanguinous  Lab Results:  Recent Labs  12/14/15 1600 12/14/15 1603 12/15/15 0011  WBC 13.5*  --  10.1  HGB 10.8* 10.9* 9.8*  HCT 30.8* 32.0* 27.3*  PLT 81*  --  63*   BMET:  Recent Labs  12/14/15 1600 12/14/15 1603 12/15/15 0400  NA 139 138 137  K 3.9 3.9 4.1  CL 109 107 109  CO2 22  --  22  GLUCOSE 114*  115* 103*  BUN CREATININE 1.37* 1.30* 1.40*  CALCIUM 7.8*  --  7.8*    PT/INR:  Recent Labs  01-Jan-2016 2353  LABPROT 22.1*  INR 1.95*   ABG    Component Value Date/Time   PHART 7.436 12/15/2015 0406   HCO3 21.7 12/15/2015 0406   TCO2 23 12/15/2015 0406   ACIDBASEDEF 2.0 12/15/2015 0406   O2SAT 95.0 12/15/2015 0406   CBG (last 3)   Recent Labs  12/15/15 0505 12/15/15 0601 12/15/15 0651  GLUCAP 112* 110* 112*   CXR: bilateral lower lobe atelectasis, small pleural effusions  Assessment/Plan: S/P Procedure(s) (LRB): CORONARY ARTERY BYPASS GRAFTING x1  -SVG to RCA (N/A) CLIPPING OF ATRIAL APPENDAGE - Atricure 40 Clip (N/A) TRANSESOPHAGEAL ECHOCARDIOGRAM (TEE) (N/A) BENTALL PROCEDURE (N/A) THORACIC ASCENDING ANEURYSM REPAIR (AAA) (N/A)  1. CV: He is fairly stable on levophed 7, Milrinone 0.3, dop 7, NO 30 ppm with CI 2.5. His PA pressures and CVP are on the low side. Will wean off NO today. Has had some brief episodes of atrial fib.   2. Resp: Oxygenation continues to improve postop. He is now on 50% and PEEP 8. Continue ventilator support until chest is closed.  3.  Renal: Renal function is stable but creat is 1.4 and was normal preop.  He has significant volume excess and is about 50 lbs over preop wt if accurate. Will start lasix drip. May need to increase Levophed as he diureses.  4.  GI: NPO. Will hold off on tube feeds for now.  5.  Neuro: reportedly following commands.  6.  ID: no sign of infection   LOS: 5 days    Sean Hardy 12/15/2015

## 2015-12-16 ENCOUNTER — Inpatient Hospital Stay (HOSPITAL_COMMUNITY): Payer: Medicare Other

## 2015-12-16 LAB — COMPREHENSIVE METABOLIC PANEL
ALBUMIN: 1.8 g/dL — AB (ref 3.5–5.0)
ALT: 51 U/L (ref 17–63)
AST: 80 U/L — AB (ref 15–41)
Alkaline Phosphatase: 37 U/L — ABNORMAL LOW (ref 38–126)
Anion gap: 5 (ref 5–15)
BILIRUBIN TOTAL: 0.7 mg/dL (ref 0.3–1.2)
BUN: 29 mg/dL — AB (ref 6–20)
CO2: 23 mmol/L (ref 22–32)
Calcium: 7.4 mg/dL — ABNORMAL LOW (ref 8.9–10.3)
Chloride: 107 mmol/L (ref 101–111)
Creatinine, Ser: 1.97 mg/dL — ABNORMAL HIGH (ref 0.61–1.24)
GFR calc Af Amer: 40 mL/min — ABNORMAL LOW (ref >60)
GFR calc non Af Amer: 34 mL/min — ABNORMAL LOW (ref >60)
GLUCOSE: 113 mg/dL — AB (ref 65–99)
POTASSIUM: 4.5 mmol/L (ref 3.5–5.1)
Sodium: 135 mmol/L (ref 135–145)
TOTAL PROTEIN: 3.6 g/dL — AB (ref 6.5–8.1)

## 2015-12-16 LAB — POCT I-STAT 3, ART BLOOD GAS (G3+)
ACID-BASE DEFICIT: 6 mmol/L — AB (ref 0.0–2.0)
Acid-base deficit: 4 mmol/L — ABNORMAL HIGH (ref 0.0–2.0)
Acid-base deficit: 4 mmol/L — ABNORMAL HIGH (ref 0.0–2.0)
Acid-base deficit: 8 mmol/L — ABNORMAL HIGH (ref 0.0–2.0)
BICARBONATE: 20.6 meq/L (ref 20.0–24.0)
BICARBONATE: 20.5 meq/L (ref 20.0–24.0)
Bicarbonate: 21.1 mEq/L (ref 20.0–24.0)
Bicarbonate: 18.2 mEq/L — ABNORMAL LOW (ref 20.0–24.0)
O2 SAT: 85 %
O2 Saturation: 80 %
O2 Saturation: 77 %
O2 Saturation: 86 %
PCO2 ART: 36.9 mmHg (ref 35.0–45.0)
PH ART: 7.364 (ref 7.350–7.450)
PO2 ART: 47 mmHg — AB (ref 80.0–100.0)
PO2 ART: 68 mmHg — AB (ref 80.0–100.0)
Patient temperature: 39
Patient temperature: 39
Patient temperature: 39.3
Patient temperature: 38.9
TCO2: 22 mmol/L (ref 0–100)
TCO2: 22 mmol/L (ref 0–100)
TCO2: 22 mmol/L (ref 0–100)
TCO2: 19 mmol/L (ref 0–100)
pCO2 arterial: 40.5 mmHg (ref 35.0–45.0)
pCO2 arterial: 50.3 mmHg — ABNORMAL HIGH (ref 35.0–45.0)
pCO2 arterial: 41.9 mmHg (ref 35.0–45.0)
pH, Arterial: 7.334 — ABNORMAL LOW (ref 7.350–7.450)
pH, Arterial: 7.23 — ABNORMAL LOW (ref 7.350–7.450)
pH, Arterial: 7.255 — ABNORMAL LOW (ref 7.350–7.450)
pO2, Arterial: 52 mmHg — ABNORMAL LOW (ref 80.0–100.0)
pO2, Arterial: 66 mmHg — ABNORMAL LOW (ref 80.0–100.0)

## 2015-12-16 LAB — CBC
HEMATOCRIT: 27.9 % — AB (ref 39.0–52.0)
HEMOGLOBIN: 9.3 g/dL — AB (ref 13.0–17.0)
MCH: 31.2 pg (ref 26.0–34.0)
MCHC: 33.3 g/dL (ref 30.0–36.0)
MCV: 93.6 fL (ref 78.0–100.0)
PLATELETS: 46 10*3/uL — AB (ref 150–400)
RBC: 2.98 MIL/uL — AB (ref 4.22–5.81)
RDW: 17.4 % — ABNORMAL HIGH (ref 11.5–15.5)
WBC: 6.7 10*3/uL (ref 4.0–10.5)

## 2015-12-16 LAB — GLUCOSE, CAPILLARY
Glucose-Capillary: 109 mg/dL — ABNORMAL HIGH (ref 65–99)
Glucose-Capillary: 111 mg/dL — ABNORMAL HIGH (ref 65–99)
Glucose-Capillary: 122 mg/dL — ABNORMAL HIGH (ref 65–99)
Glucose-Capillary: 93 mg/dL (ref 65–99)

## 2015-12-16 MED ORDER — VECURONIUM BROMIDE 10 MG IV SOLR
INTRAVENOUS | Status: AC
  Filled 2015-12-16: qty 10

## 2015-12-16 MED ORDER — SODIUM BICARBONATE 8.4 % IV SOLN
INTRAVENOUS | Status: AC
  Administered 2015-12-16: 05:00:00
  Filled 2015-12-16: qty 100

## 2015-12-16 MED ORDER — STERILE WATER FOR INJECTION IJ SOLN
INTRAMUSCULAR | Status: AC
  Filled 2015-12-16: qty 10

## 2015-12-17 LAB — TYPE AND SCREEN
ABO/RH(D): A POS
ANTIBODY SCREEN: NEGATIVE
UNIT DIVISION: 0
UNIT DIVISION: 0
Unit division: 0
Unit division: 0

## 2015-12-17 MED FILL — Heparin Sodium (Porcine) Inj 1000 Unit/ML: INTRAMUSCULAR | Qty: 40 | Status: AC

## 2015-12-17 MED FILL — Sodium Chloride IV Soln 0.9%: INTRAVENOUS | Qty: 4000 | Status: AC

## 2015-12-17 MED FILL — Mannitol IV Soln 20%: INTRAVENOUS | Qty: 500 | Status: AC

## 2015-12-17 MED FILL — Calcium Chloride Inj 10%: INTRAVENOUS | Qty: 10 | Status: AC

## 2015-12-17 MED FILL — Lidocaine HCl IV Inj 20 MG/ML: INTRAVENOUS | Qty: 20 | Status: AC

## 2015-12-17 MED FILL — Sodium Bicarbonate IV Soln 8.4%: INTRAVENOUS | Qty: 50 | Status: AC

## 2015-12-17 MED FILL — Heparin Sodium (Porcine) Inj 1000 Unit/ML: INTRAMUSCULAR | Qty: 30 | Status: AC

## 2015-12-17 MED FILL — Electrolyte-R (PH 7.4) Solution: INTRAVENOUS | Qty: 8000 | Status: AC

## 2015-12-18 ENCOUNTER — Encounter (HOSPITAL_COMMUNITY): Payer: Self-pay | Admitting: Surgery

## 2015-12-18 MED FILL — Heparin Sodium (Porcine) Inj 1000 Unit/ML: INTRAMUSCULAR | Qty: 2.5 | Status: AC

## 2015-12-18 MED FILL — Papaverine HCl Inj 30 MG/ML: INTRAMUSCULAR | Qty: 2 | Status: AC

## 2015-12-18 NOTE — Anesthesia Postprocedure Evaluation (Signed)
Anesthesia Post Note  Patient: Sean Hardy  Procedure(s) Performed: Procedure(s) (LRB): CORONARY ARTERY BYPASS GRAFTING x1  -SVG to RCA (N/A) CLIPPING OF ATRIAL APPENDAGE - Atricure 40 Clip (N/A) TRANSESOPHAGEAL ECHOCARDIOGRAM (TEE) (N/A) BENTALL PROCEDURE (N/A) THORACIC ASCENDING ANEURYSM REPAIR (AAA) (N/A)  Patient location during evaluation: ICU Anesthesia Type: General Level of consciousness: patient remains intubated per anesthesia plan Vital Signs Assessment: post-procedure vital signs reviewed and stable Respiratory status: patient remains intubated per anesthesia plan Cardiovascular status: stable Anesthetic complications: no    Last Vitals:  Vitals:   12/12/2015 0505 12/01/2015 0510  BP: (!) 83/42 (!) 74/42  Pulse: 80 80  Resp: (!) 0 (!) 0  Temp: (!) 39 C (!) 39 C    Last Pain:  Vitals:   12/08/2015 0000  TempSrc: Core (Comment)  PainSc:                  EDWARDS,Emilyanne Mcgough

## 2015-12-18 NOTE — Anesthesia Preprocedure Evaluation (Signed)
Anesthesia Evaluation  Patient identified by MRN, date of birth, ID band Patient awake    Reviewed: Allergy & Precautions, NPO status , Patient's Chart, lab work & pertinent test results  Airway Mallampati: II  TM Distance: >3 FB     Dental   Pulmonary shortness of breath, former smoker,    breath sounds clear to auscultation       Cardiovascular hypertension, + CAD, + CABG and +CHF   Rhythm:Regular Rate:Normal     Neuro/Psych    GI/Hepatic Neg liver ROS, GERD  ,  Endo/Other  Hypothyroidism   Renal/GU Renal disease     Musculoskeletal  (+) Arthritis ,   Abdominal   Peds  Hematology   Anesthesia Other Findings   Reproductive/Obstetrics                             Anesthesia Physical Anesthesia Plan  ASA: IV  Anesthesia Plan: General   Post-op Pain Management:    Induction: Intravenous  Airway Management Planned: Oral ETT  Additional Equipment: Arterial line, PA Cath, Ultrasound Guidance Line Placement and TEE  Intra-op Plan:   Post-operative Plan: Post-operative intubation/ventilation  Informed Consent:   Dental advisory given  Plan Discussed with: CRNA and Anesthesiologist  Anesthesia Plan Comments:         Anesthesia Quick Evaluation

## 2015-12-25 NOTE — Progress Notes (Signed)
CCM noted of pt desaturation 87-89% after placed on 100% FiO2 on ventilator. Dr. Darrick Penna ordered to increase PEEP to get O2 sat >90 and to decrease when pt's sat are more stable

## 2015-12-25 NOTE — Progress Notes (Signed)
Pt began desat 83-86% attempted recruitment by RT. CCM fellow to bedside. States will talk to Dr. Darrick Penna about adding paralytic.

## 2015-12-25 NOTE — Progress Notes (Signed)
Pt back in afib 120's. Cuff pressure MAP decreased to 50-60s. Labs and ABG completed. ABG acidotic. 2 Amps of Bicarb given per Dr. Laneta Simmers, unable to view labs at this time with epic upgrade. Dr. Darrick Penna camera in as well as Dr. Laneta Simmers on phone. Attempting to decrease pt's PEEP to help maintain decent blood pressure. Max on levophed with cuff MAP in the 50's. HR in slow afib in 50-60's, pacer turned on

## 2015-12-25 NOTE — Progress Notes (Signed)
Per Dr. Laneta Simmers, changed pt status to DNR to prevent compressions on open chest. Stop amiodarone drip and hold asa. Will notify Dr. Laneta Simmers when labs resulted

## 2015-12-25 NOTE — Discharge Summary (Signed)
Physician Discharge Summary  Patient ID: FLYNN FREZZA MRN: [id number] DOB/AGE: January 02, 1951 65 y.o.  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 Sepsis   Discharged Condition: expired  Hospital Course:   The patient was admitted on 2016/01/09 after presenting in the morning for surgery and finding out that his Eliquis was only stopped the day before despite clear instructions to the nursing facility to stop it 5 days before surgery. He was started on heparin and observed for a few more days to allow the Eliquis time to wash out of his system. He underwent CABG x 1, clipping of the LAA, replacement of his ascending aorta and bioprosthetic composite Bentall procedure on 12/14/2015. This was a long complicated procedure as documented in his operative note. His sternum could not be closed due to hypoxemia and some RV dysfunction. He was taken to the SICU with the sternum opened and an Esmark closing the wound, covered with a wound VAC. He was on multiple vasopressors and inotropes and requiring 100% oxygen with 10 PEEP. He seemed to stabilize over the first postop day and his FiO2 was decreased to 90% and his PEEP to 8. However on the morning of the second postop day he developed a progressive fever to 103, increased vasopressor requirement including maximum dose levophed and vasopressin, and developed hypoxemia. He was seen in consultation by CCM and ventilator changes made which seemed to help initially but his oxygenation continued to deteriorate over the course of the day requiring chemical paralysis and increasing PEEP. His CXR and  clinical course were consistent with ARDS. As the PEEP was increased he developed hypotension and acidosis with oliguria. The PEEP was decreased but he continued to deteriorate. I did not feel that there was any other intervention to sustain him since he was on maximum dose vasopressors, paralyzed on the vent with hypoxemia on 100% oxygen and maximum tolerated PEEP, and febrile to 103 despite broad spectrum antibiotics and tylenol. He was made DNR and quickly expired.  Consults: pulmonary/intensive care  Significant Diagnostic Studies:   Treatments: surgery: CABG x 1, Clipping of LAA, Biological Bentall procedure and replacement of the ascending aorta.  Discharge Exam: Blood pressure (!) 74/42, pulse 80, temperature (!) 102.2 F (39 C), resp. rate (!) 0, height 6\' 1"  (1.854 m), weight (!) 136.6 kg (301 lb 2.4 oz), SpO2 (!) 86 %. expired  Disposition: 20-Expired     Medication List    ASK your doctor about these medications   allopurinol 300 MG tablet Commonly known as:  ZYLOPRIM Take 300 mg by mouth every morning.   amiodarone 200 MG tablet Commonly known as:  PACERONE TAKE ONE TABLET BY MOUTH EVERY DAY   amLODipine 5 MG tablet Commonly known as:  NORVASC Take 10 mg by mouth every morning.   aspirin 81 MG EC tablet Take 1 tablet (81 mg total) by mouth daily.   atorvastatin 40 MG tablet Commonly known as:  LIPITOR Take 40 mg by mouth daily at 6 PM.   furosemide 40 MG tablet Commonly known as:  LASIX Take 40 mg by mouth every morning.   isosorbide mononitrate 30 MG 24 hr tablet Commonly known as:  IMDUR Take 1 tablet (30 mg total) by  mouth daily.   levothyroxine 175 MCG tablet Commonly known as:  SYNTHROID, LEVOTHROID Take 175 mcg by mouth daily before breakfast.   lisinopril 5 MG tablet Commonly known as:  PRINIVIL,ZESTRIL Take 5 mg by mouth every morning.   metoprolol 50 MG tablet Commonly known as:  LOPRESSOR Take 50 mg by mouth 2 (two) times daily.    nitroGLYCERIN 0.4 MG SL tablet Commonly known as:  NITROSTAT Place 0.4 mg under the tongue every 5 (five) minutes as needed for chest pain. Reported on 11/29/2015   Olopatadine HCl 0.2 % Soln Place 1 drop into both eyes every morning.   omeprazole 20 MG capsule Commonly known as:  PRILOSEC Take 20 mg by mouth every morning. On an empty stomach   ondansetron 4 MG tablet Commonly known as:  ZOFRAN Take 4 mg by mouth daily as needed for nausea or vomiting (30 minutes prior to transportation appt).   polyethylene glycol packet Commonly known as:  MIRALAX / GLYCOLAX Take 17 g by mouth daily as needed for mild constipation. Reported on 11/29/2015   rivaroxaban 20 MG Tabs tablet Commonly known as:  XARELTO Take 1 tablet (20 mg total) by mouth daily with supper.   senna 8.6 MG tablet Commonly known as:  SENOKOT Take 1 tablet by mouth 2 (two) times daily.   solifenacin 5 MG tablet Commonly known as:  VESICARE Take 5 mg by mouth at bedtime.   SUPER OMEGA-EPA 1000 MG Caps Take 2,000 mg by mouth 2 (two) times daily. Reported on 11/29/2015   tamsulosin 0.4 MG Caps capsule Commonly known as:  FLOMAX Take 0.4 mg by mouth at bedtime.   traMADol 50 MG tablet Commonly known as:  ULTRAM Take 50 mg by mouth every 8 (eight) hours.   VITAMIN D3 ULTRA POTENCY 16109 units Tabs Generic drug:  Cholecalciferol Take 1 tablet by mouth every 30 (thirty) days. On the 15th of every month        Signed: Alleen Borne 12/21/2015, 4:28 PM

## 2015-12-25 NOTE — Progress Notes (Signed)
Spoke to Dr. Laneta Simmers about pt's soft arterial pressures. Cuff vs arterial pressures not correlating. Dr. Laneta Simmers wants manage pressors based on cuff pressure and wants to be called if changes need to made. ABG done per Dr. Sharee Pimple order to determine cause for desats.

## 2015-12-25 NOTE — Progress Notes (Signed)
Dr. Darrick Penna ordered vecuronium. Pt already on BIS monitor, current BIS in the 40's. Vecuronium 10mg  bolus given per Dr. Darrick Penna. TOF 2/4 at 78mA. Waiting for vec drip to come from pharmacy. Pt also started on ARDs protocol. Sats increased to low 90's with high PEEP.

## 2015-12-25 NOTE — Clinical Social Work Note (Signed)
CSW notified SNF of pt's passing.

## 2015-12-25 DEATH — deceased

## 2016-01-21 ENCOUNTER — Ambulatory Visit: Payer: Medicare Other | Admitting: Adult Health

## 2016-02-21 ENCOUNTER — Ambulatory Visit: Payer: Medicare Other | Admitting: Cardiology

## 2016-02-29 ENCOUNTER — Encounter (HOSPITAL_COMMUNITY): Payer: Self-pay

## 2016-06-18 IMAGING — CT CT ANGIO CHEST-ABD-PELV FOR DISSECTION W/ AND WO/W CM
3 of 12 series · 11 of 38 positions shown, 17 images · IV contrast (ISOVUE)
Comparison: CT abdomen 02/12/2015.  Chest CTA 08/30/2015.

CLINICAL DATA: Recurrent chest pain since last night, temporarily
relieved by nitroglycerin. History of thoracic aortic aneurysm.

EXAM:
CT ANGIOGRAPHY CHEST, ABDOMEN AND PELVIS
TECHNIQUE: Multidetector CT imaging through the chest, abdomen and pelvis was
performed using the standard protocol during bolus administration of
intravenous contrast. Multiplanar reconstructed images and MIPs were
obtained and reviewed to evaluate the vascular anatomy.
CONTRAST:  100 ml [id number].

[Series 6: arterial · axial · arterial · 0.94mm/px · z∈[-381,-73]mm · 8 of 199 slices shown, 13 images (1 of 2)]
[im 23/199  mediastinal]
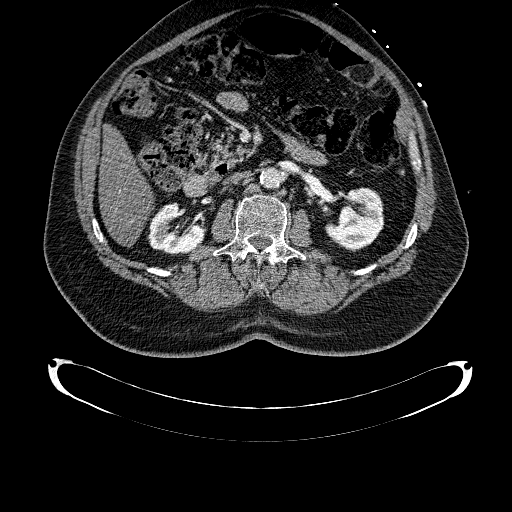
[im 23/199  bone]
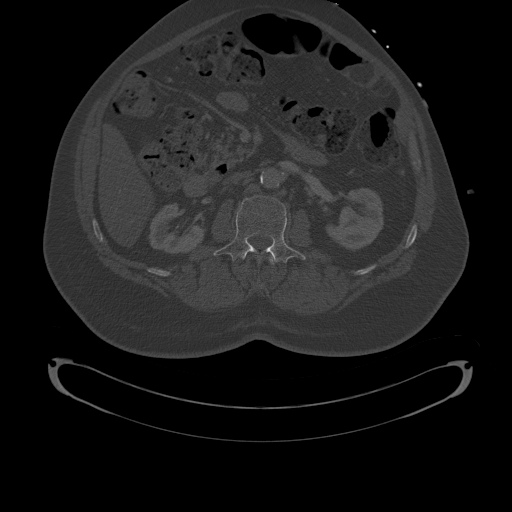
[im 45/199  mediastinal]
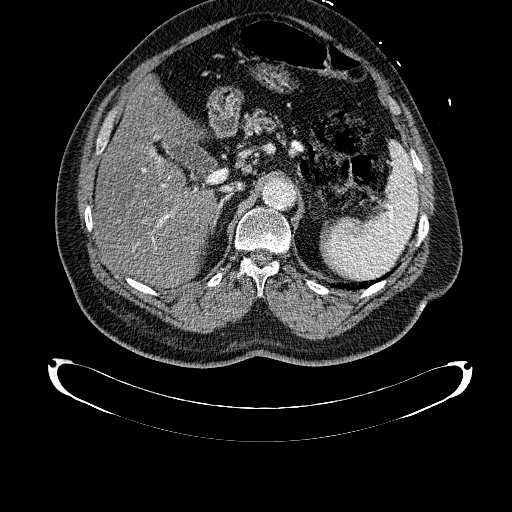
[im 67/199  mediastinal]
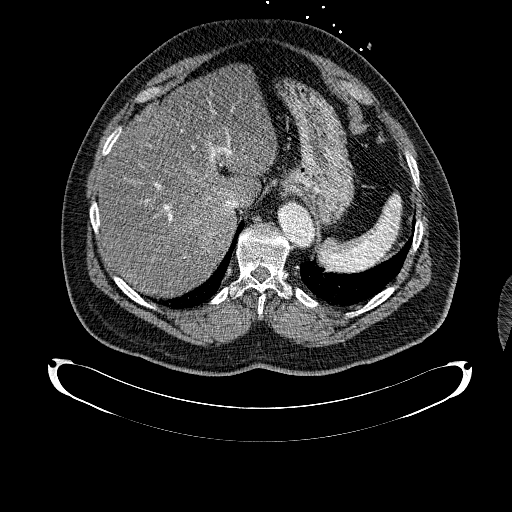
[im 89/199  mediastinal]
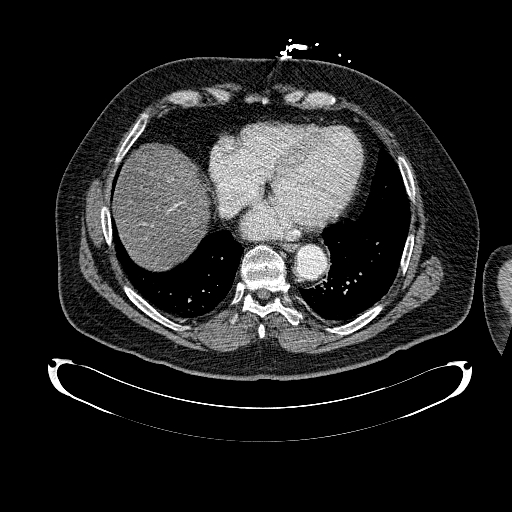
[im 111/199  mediastinal]
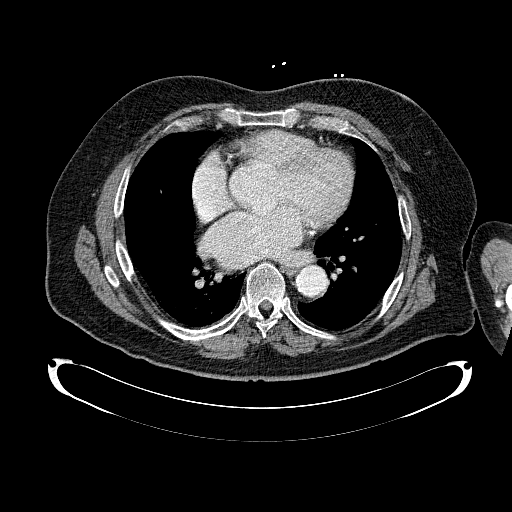
[im 111/199  lung]
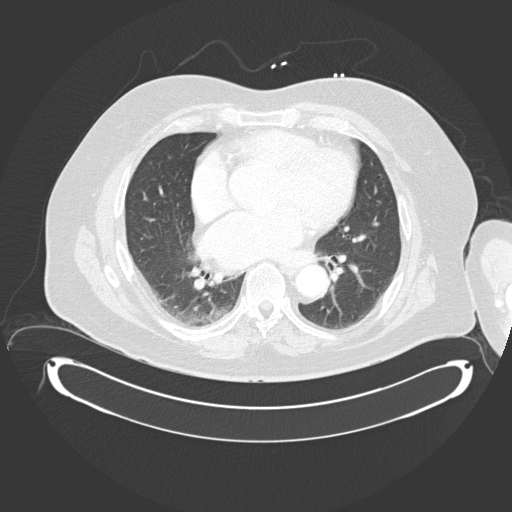
[im 133/199  mediastinal]
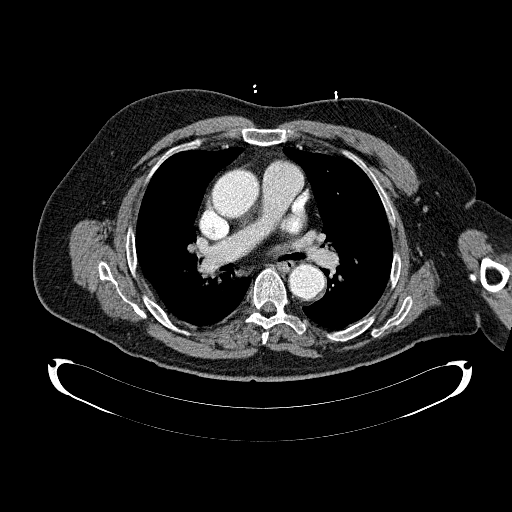
[im 133/199  lung]
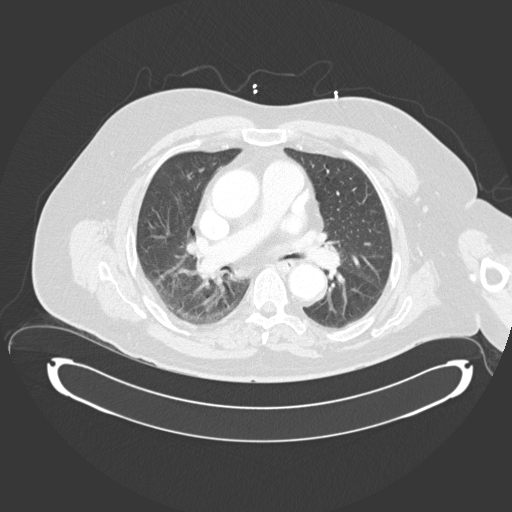
[im 155/199  mediastinal]
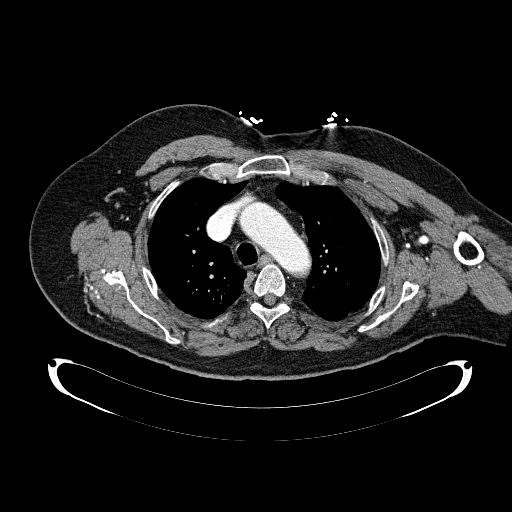
[im 155/199  lung]
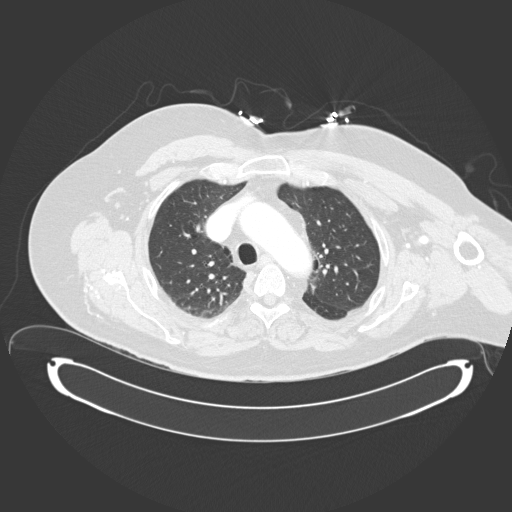
[im 177/199  mediastinal]
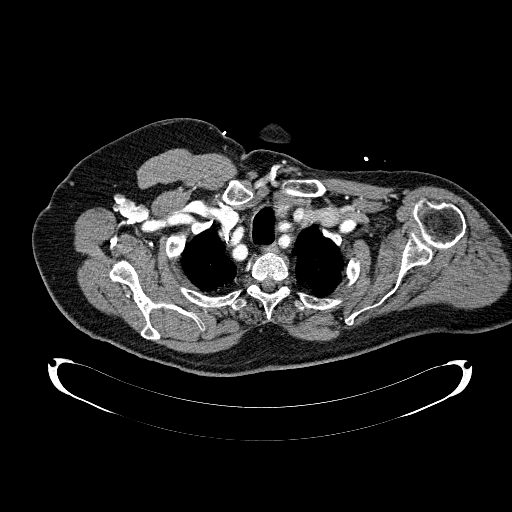
[im 177/199  lung]
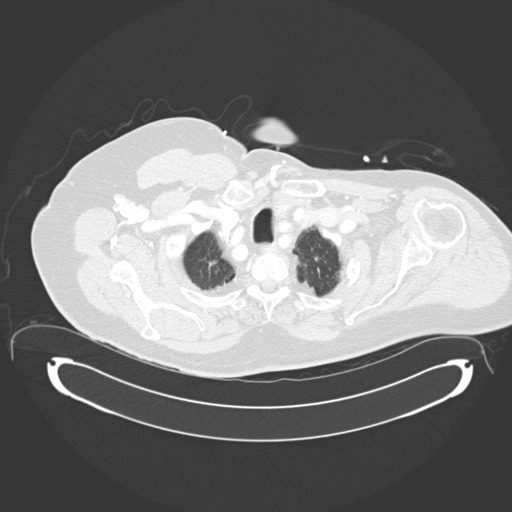

[Series 7: arterial · axial · arterial · 0.94mm/px · z∈[-718,-674]mm · 2 of 199 slices shown (2 of 2)]
[im 23/199  mediastinal]
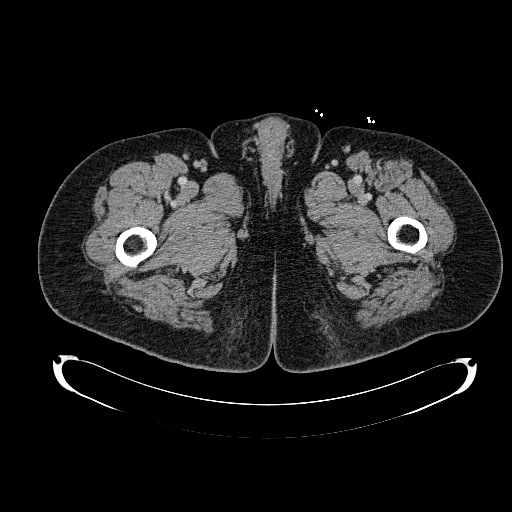
[im 45/199  mediastinal]
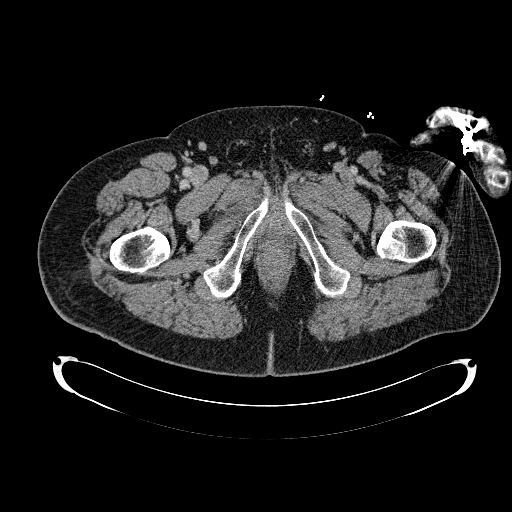

[Series 9: coronal arterial · coronal · arterial · 0.72mm/px · 1 of 190 slices shown, 2 images]
[im 95/190  mediastinal]
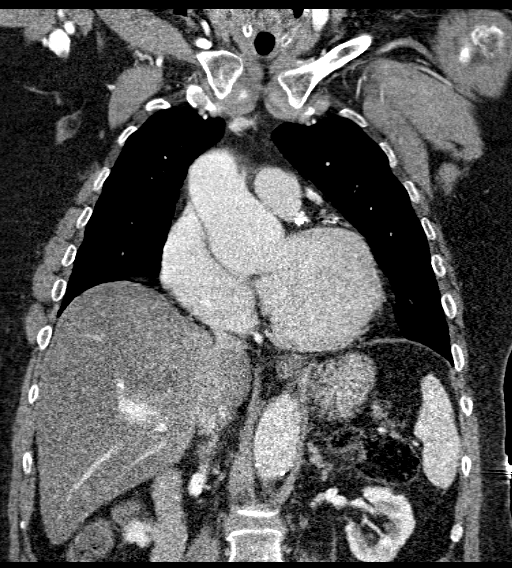
[im 95/190  bone]
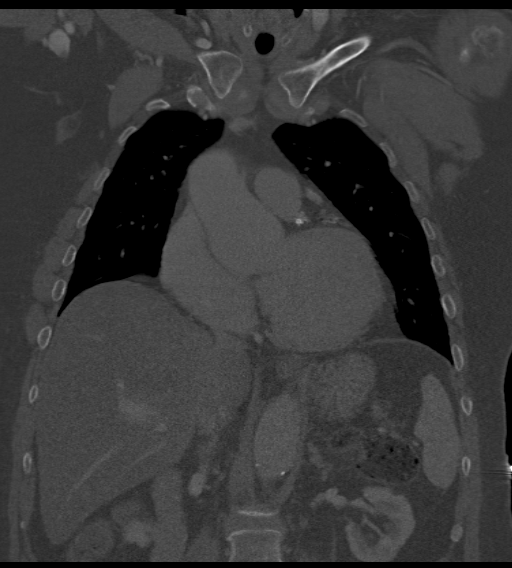

[11 of 38 positions shown; findings below may reference images not displayed]

FINDINGS: CTA CHEST FINDINGS

Mediastinum: Pre contrast images demonstrate diffuse
atherosclerosis, most advanced within the coronary arteries. There
are no displaced intimal calcifications within the thoracic aorta.
Postcontrast, the aortic lumen opacifies normally. There is no
evidence of dissection. There is persistent dilatation of the aortic
root, measuring up to 5.9 x 5.5 cm on image 83. The pulmonary
arteries are suboptimally opacified with contrast. There is no
evidence of acute pulmonary embolism. The heart size is stable.
There is no pericardial effusion. There are no enlarged mediastinal,
hilar or axillary lymph nodes. The thyroid gland, trachea and
esophagus demonstrate no significant findings.

Lungs/Pleura: There is no pleural effusion.The lungs appear stable
with mild biapical scarring and bibasilar atelectasis. There is no
confluent airspace opacity or suspicious pulmonary nodule.

Musculoskeletal/Chest wall: No chest wall lesion or acute osseous
findings.

Review of the MIP images confirms the above findings.

CTA ABDOMEN AND PELVIS FINDINGS

Hepatobiliary: The hepatic density is diffusely decreased consistent
with steatosis. There is relative sparing with around the
gallbladder. No focal hepatic lesions are identified. No evidence of
gallstones, gallbladder wall thickening or biliary dilatation.

Pancreas: Unremarkable. No pancreatic ductal dilatation or
surrounding inflammatory changes.

Spleen: Normal in size without focal abnormality.

Adrenals/Urinary Tract: Both adrenal glands appear normal. Both
kidneys demonstrate cortical scarring and small cysts. There is no
evidence of renal mass, urinary tract calculus or hydronephrosis.

Stomach/Bowel: No evidence of bowel wall thickening, distention or
surrounding inflammatory change.

Vascular/Lymphatic: There is diffuse atherosclerosis of the aorta,
its branches and the iliac arteries. The right renal artery is
duplicated. There is no evidence of large vessel occlusion, aneurysm
or dissection. There are no enlarged abdominal pelvic lymph nodes.

Reproductive: The prostate gland and seminal vesicles appear
unremarkable.

Other: Small umbilical hernia containing only fat. There are
postsurgical changes in the anterior abdominal wall. There is
dependent edema within the subcutaneous fat of the back.

Musculoskeletal: No acute or significant osseous findings.

Review of the MIP images confirms the above findings.
IMPRESSION: 1. Stable diffuse aortic and branch vessel atherosclerosis. No
evidence of aortic dissection or other acute vascular findings.
2. Stable dilatation of the aortic root compared with recent chest
CTA of 6 weeks ago.
3. Hepatic steatosis, renal cortical thinning and probable small
renal cysts.

## 2016-08-18 IMAGING — CR DG CHEST 1V PORT
1 series · 1 of 1 positions shown · non-contrast
Comparison: 12/14/2015

CLINICAL DATA: Status post ascending thoracic aorta repair with
aortic valve replacement and coronary artery bypass.

EXAM:
PORTABLE CHEST 1 VIEW

[AP]
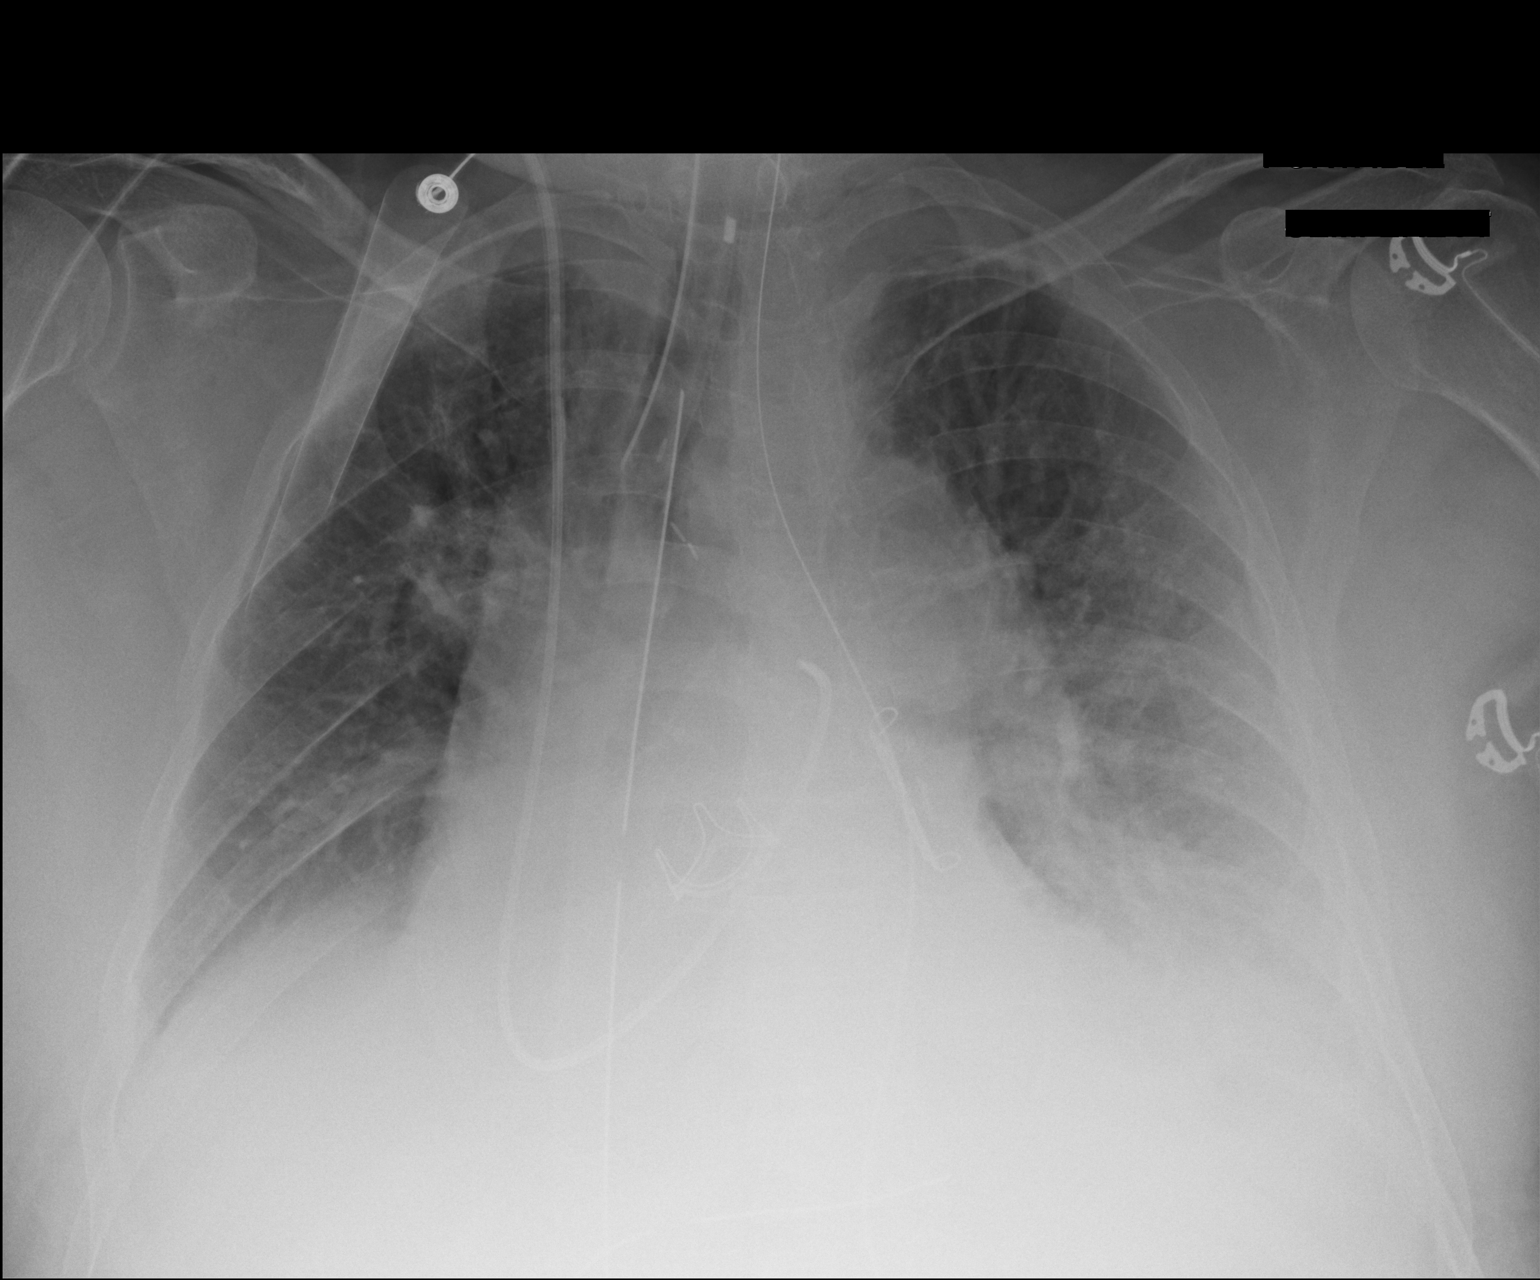

[1 of 1 positions shown; findings below may reference images not displayed]

FINDINGS: Endotracheal remains with the tip approximately 3.5 cm above the
carina. Swan-Ganz catheter tip lies in the proximal right pulmonary
artery. Mediastinal drain and nasogastric tube remain. Stable small
bilateral pleural effusions and left lower lobe atelectasis. No
pneumothorax identified. The heart size and mediastinal contours are
stable. Stable appearance of aortic valve and left atrial appendage
clip.
IMPRESSION: Stable bilateral pleural effusions and left lower lobe atelectasis.
No pneumothorax.
